# Patient Record
Sex: Female | Born: 1950
Health system: Southern US, Community
[De-identification: ages and names within clinical notes are randomized; demographics above are authoritative.]

## PROBLEM LIST (undated history)

## (undated) DIAGNOSIS — F329 Major depressive disorder, single episode, unspecified: Secondary | ICD-10-CM

## (undated) DIAGNOSIS — E039 Hypothyroidism, unspecified: Secondary | ICD-10-CM

## (undated) DIAGNOSIS — E559 Vitamin D deficiency, unspecified: Secondary | ICD-10-CM

## (undated) DIAGNOSIS — F32A Depression, unspecified: Secondary | ICD-10-CM

## (undated) DIAGNOSIS — B009 Herpesviral infection, unspecified: Secondary | ICD-10-CM

## (undated) DIAGNOSIS — M1990 Unspecified osteoarthritis, unspecified site: Secondary | ICD-10-CM

## (undated) DIAGNOSIS — J349 Unspecified disorder of nose and nasal sinuses: Secondary | ICD-10-CM

## (undated) HISTORY — PX: TONSILLECTOMY: SUR1361

## (undated) HISTORY — PX: WISDOM TOOTH EXTRACTION: SHX21

## (undated) HISTORY — PX: EXCISION OF TONGUE LESION: SHX6434

## (undated) HISTORY — PX: COLONOSCOPY: SHX5424

## (undated) HISTORY — PX: SHOULDER SURGERY: SHX246

## (undated) HISTORY — PX: DILATION AND CURETTAGE OF UTERUS: SHX78

---

## 2013-06-29 ENCOUNTER — Ambulatory Visit: Payer: Self-pay | Admitting: Adult Health

## 2013-07-05 ENCOUNTER — Ambulatory Visit: Payer: Self-pay | Admitting: Family Medicine

## 2013-11-03 ENCOUNTER — Other Ambulatory Visit (HOSPITAL_COMMUNITY)
Admission: RE | Admit: 2013-11-03 | Discharge: 2013-11-03 | Disposition: A | Discharge status: Home / Self Care | Payer: BC Managed Care – PPO | Source: Ambulatory Visit | Attending: Family Medicine | Admitting: Family Medicine

## 2013-11-03 ENCOUNTER — Other Ambulatory Visit: Payer: Self-pay | Admitting: Family Medicine

## 2013-11-03 DIAGNOSIS — Z1151 Encounter for screening for human papillomavirus (HPV): Secondary | ICD-10-CM | POA: Insufficient documentation

## 2013-11-03 DIAGNOSIS — Z0000 Encounter for general adult medical examination without abnormal findings: Secondary | ICD-10-CM | POA: Insufficient documentation

## 2013-12-14 ENCOUNTER — Other Ambulatory Visit: Payer: Self-pay | Admitting: Gastroenterology

## 2014-07-19 ENCOUNTER — Encounter (HOSPITAL_COMMUNITY): Payer: Self-pay | Admitting: *Deleted

## 2014-07-27 ENCOUNTER — Encounter (HOSPITAL_COMMUNITY): Payer: Self-pay | Admitting: Pharmacy Technician

## 2014-08-01 ENCOUNTER — Other Ambulatory Visit: Payer: Self-pay | Admitting: Gastroenterology

## 2014-08-01 NOTE — Addendum Note (Signed)
Addended by: Wilford Corner on: 08/01/2014 04:01 PM   Modules accepted: Orders

## 2014-08-02 ENCOUNTER — Ambulatory Visit (HOSPITAL_COMMUNITY)
Admission: RE | Admit: 2014-08-02 | Discharge: 2014-08-02 | Disposition: A | Discharge status: Home / Self Care | Payer: BC Managed Care – PPO | Source: Ambulatory Visit | Attending: Gastroenterology | Admitting: Gastroenterology

## 2014-08-02 ENCOUNTER — Encounter (HOSPITAL_COMMUNITY): Payer: Self-pay | Admitting: Anesthesiology

## 2014-08-02 ENCOUNTER — Ambulatory Visit (HOSPITAL_COMMUNITY): Payer: BC Managed Care – PPO | Admitting: Anesthesiology

## 2014-08-02 ENCOUNTER — Encounter (HOSPITAL_COMMUNITY)
Admission: RE | Disposition: A | Discharge status: Home / Self Care | Payer: Self-pay | Source: Ambulatory Visit | Attending: Gastroenterology

## 2014-08-02 DIAGNOSIS — F329 Major depressive disorder, single episode, unspecified: Secondary | ICD-10-CM | POA: Diagnosis not present

## 2014-08-02 DIAGNOSIS — D123 Benign neoplasm of transverse colon: Secondary | ICD-10-CM | POA: Diagnosis present

## 2014-08-02 DIAGNOSIS — M1990 Unspecified osteoarthritis, unspecified site: Secondary | ICD-10-CM | POA: Diagnosis not present

## 2014-08-02 DIAGNOSIS — Z87891 Personal history of nicotine dependence: Secondary | ICD-10-CM | POA: Insufficient documentation

## 2014-08-02 DIAGNOSIS — E039 Hypothyroidism, unspecified: Secondary | ICD-10-CM | POA: Diagnosis not present

## 2014-08-02 DIAGNOSIS — D124 Benign neoplasm of descending colon: Secondary | ICD-10-CM | POA: Insufficient documentation

## 2014-08-02 DIAGNOSIS — D122 Benign neoplasm of ascending colon: Secondary | ICD-10-CM | POA: Insufficient documentation

## 2014-08-02 DIAGNOSIS — K635 Polyp of colon: Secondary | ICD-10-CM | POA: Diagnosis present

## 2014-08-02 DIAGNOSIS — K648 Other hemorrhoids: Secondary | ICD-10-CM | POA: Insufficient documentation

## 2014-08-02 HISTORY — DX: Major depressive disorder, single episode, unspecified: F32.9

## 2014-08-02 HISTORY — DX: Unspecified osteoarthritis, unspecified site: M19.90

## 2014-08-02 HISTORY — DX: Depression, unspecified: F32.A

## 2014-08-02 HISTORY — DX: Vitamin D deficiency, unspecified: E55.9

## 2014-08-02 HISTORY — DX: Unspecified disorder of nose and nasal sinuses: J34.9

## 2014-08-02 HISTORY — PX: COLONOSCOPY WITH PROPOFOL: SHX5780

## 2014-08-02 HISTORY — PX: HOT HEMOSTASIS: SHX5433

## 2014-08-02 HISTORY — DX: Hypothyroidism, unspecified: E03.9

## 2014-08-02 SURGERY — COLONOSCOPY WITH PROPOFOL
Anesthesia: Monitor Anesthesia Care

## 2014-08-02 MED ORDER — PROPOFOL 10 MG/ML IV BOLUS
INTRAVENOUS | Status: AC
  Filled 2014-08-02: qty 20

## 2014-08-02 MED ORDER — PROPOFOL INFUSION 10 MG/ML OPTIME
INTRAVENOUS | Status: DC | PRN
  Administered 2014-08-02: 300 ug/kg/min via INTRAVENOUS

## 2014-08-02 MED ORDER — SODIUM CHLORIDE 0.9 % IV SOLN: INTRAVENOUS | Status: DC

## 2014-08-02 MED ORDER — LACTATED RINGERS IV SOLN
INTRAVENOUS | Status: DC
Start: 2014-08-02 — End: 2014-08-02
  Administered 2014-08-02: 1000 mL via INTRAVENOUS

## 2014-08-02 SURGICAL SUPPLY — 21 items

## 2014-08-02 NOTE — Transfer of Care (Signed)
Immediate Anesthesia Transfer of Care Note  Patient: Cindy Perez  Procedure(s) Performed: Procedure(s): COLONOSCOPY WITH PROPOFOL (N/A) HOT HEMOSTASIS (ARGON PLASMA COAGULATION/BICAP) (N/A)  Patient Location: PACU  Anesthesia Type:MAC  Level of Consciousness: awake, alert  and oriented  Airway & Oxygen Therapy: Patient Spontanous Breathing and Patient connected to face mask oxygen  Post-op Assessment: Report given to PACU RN and Post -op Vital signs reviewed and stable  Post vital signs: Reviewed and stable  Complications: No apparent anesthesia complications

## 2014-08-02 NOTE — Op Note (Signed)
[facility] [place], [id number]   COLONOSCOPY PROCEDURE REPORT     EXAM DATE: 08/02/2014  PATIENT NAME:      Cindy Perez, Cindy Perez           MR #:      [id number]  BIRTHDATE:       09-Jun-1951      VISIT #:     [id number]  ATTENDING:     Wilford Corner, MD     STATUS:     outpatient REFERRING MD: ASA CLASS:        Class II  INDICATIONS:  The patient is a 63 yr old female here for a colonoscopy due to colon polyp. PROCEDURE PERFORMED:     Colonoscopy with snare polypectomy MEDICATIONS:     Monitored anesthesia care and Per Anesthesia  ESTIMATED BLOOD LOSS:     None  CONSENT: The patient understands the risks and benefits of the procedure and understands that these risks include, but are not limited to: sedation, allergic reaction, infection, perforation and/or bleeding. Alternative means of evaluation and treatment include, among others: physical exam, x-rays, and/or surgical intervention. The patient elects to proceed with this endoscopic procedure.  DESCRIPTION OF PROCEDURE: During intra-op preparation period all mechanical & medical equipment was checked for proper function. Hand hygiene and appropriate measures for infection prevention was taken. After the risks, benefits and alternatives of the procedure were thoroughly explained, Informed consent was verified, confirmed and timeout was successfully executed by the treatment team. A digital exam revealed no abnormalities of the rectum.      The Pentax Ped Colon [id number] endoscope was introduced through the anus and advanced to the cecum, which was identified by both the appendix and ileocecal valve. The terminal ileum was intubated and was normal in appearance. The prep was good. The instrument was then slowly withdrawn as the colon was fully examined. In the ascending colon a previously tattooed area was noted and proximal to this mucosa was a 1.4 cm semi-sessile polyp that  was removed with snare cautery without any immediate complications.  At the site of the tattooed mucosa was a 4 mm and 68mm semi-sessile polyp that could not be snared due to it flattening out with each attempt. APC was then used to fulgurate this tissue. In the descending colon there was a 3 mm semi-sessile polyp that was removed with cold biopsy forceps. Multiple sigmoid diverticuli were seen and several inverted diverticuli were also noted. Retroflexed views revealed small internal hemorrhoids.  The scope was then completely withdrawn from the patient and the procedure terminated.     ADVERSE EVENTS:      There were no immediate complications.   IMPRESSIONS:     1. Ascending colon polyp - s/p snare cautery 2. S/P APC of residual polyp in ascending colon 3. Descending colon polyp removed with cold biopsy forceps 4. Internal hemorrhoids  RECOMMENDATIONS:     No aspirin products for 2 weeks; F/U on path; Timing of repeat colon pending path results   Wilford Corner, MD eSigned:  Wilford Corner, MD 08/02/2014 11:08 AM   cc:  Cari Caraway, MD  CPT CODES: ICD CODES:  The ICD and CPT codes recommended by this software are interpretations from the data that the clinical staff has captured with the software.  The verification of the translation of this report to the ICD and CPT codes and modifiers is the sole responsibility of the health care institution and practicing physician where this report  was generated.  [employer] will not be held responsible for the validity of the ICD and CPT codes included on this report.  AMA assumes no liability for data contained or not contained herein. C[employer] is a Designer, television/film set of the [employer].  PATIENT NAME:  Cindy Perez MR#: [id number]

## 2014-08-02 NOTE — Anesthesia Preprocedure Evaluation (Signed)
Anesthesia Evaluation  Patient identified by MRN, date of birth, ID band Patient awake    Reviewed: Allergy & Precautions, H&P , NPO status , Patient's Chart, lab work & pertinent test results  History of Anesthesia Complications Negative for: history of anesthetic complications  Airway Mallampati: II  TM Distance: >3 FB Neck ROM: Full    Dental  (+) Teeth Intact   Pulmonary neg shortness of breath, neg sleep apnea, neg COPDneg recent URI, former smoker,  breath sounds clear to auscultation        Cardiovascular negative cardio ROS  Rhythm:Regular     Neuro/Psych PSYCHIATRIC DISORDERS Depression negative neurological ROS     GI/Hepatic Neg liver ROS, Colon polyp   Endo/Other  Hypothyroidism   Renal/GU negative Renal ROS     Musculoskeletal  (+) Arthritis -, Osteoarthritis,    Abdominal   Peds  Hematology negative hematology ROS (+)   Anesthesia Other Findings   Reproductive/Obstetrics                             Anesthesia Physical Anesthesia Plan  ASA: II  Anesthesia Plan: MAC   Post-op Pain Management:    Induction: Intravenous  Airway Management Planned: Natural Airway and Simple Face Mask  Additional Equipment: None  Intra-op Plan:   Post-operative Plan:   Informed Consent: I have reviewed the patients History and Physical, chart, labs and discussed the procedure including the risks, benefits and alternatives for the proposed anesthesia with the patient or authorized representative who has indicated his/her understanding and acceptance.   Dental advisory given  Plan Discussed with: CRNA and Surgeon  Anesthesia Plan Comments:         Anesthesia Quick Evaluation

## 2014-08-02 NOTE — Discharge Instructions (Addendum)
No aspirin or ibuprofen products for 2 weeks. Will call you when the pathology results are complete.   Colonoscopy, Care After Refer to this sheet in the next few weeks. These instructions provide you with information on caring for yourself after your procedure. Your health care provider may also give you more specific instructions. Your treatment has been planned according to current medical practices, but problems sometimes occur. Call your health care provider if you have any problems or questions after your procedure. WHAT TO EXPECT AFTER THE PROCEDURE  After your procedure, it is typical to have the following:  A small amount of blood in your stool.  Moderate amounts of gas and mild abdominal cramping or bloating. HOME CARE INSTRUCTIONS  Do not drive, operate machinery, or sign important documents for 24 hours.  You may shower and resume your regular physical activities, but move at a slower pace for the first 24 hours.  Take frequent rest periods for the first 24 hours.  Walk around or put a warm pack on your abdomen to help reduce abdominal cramping and bloating.  Drink enough fluids to keep your urine clear or pale yellow.  You may resume your normal diet as instructed by your health care provider. Avoid heavy or fried foods that are hard to digest.  Avoid drinking alcohol for 24 hours or as instructed by your health care provider.  Only take over-the-counter or prescription medicines as directed by your health care provider.  If a tissue sample (biopsy) was taken during your procedure:  Do not take aspirin or blood thinners for 7 days, or as instructed by your health care provider.  Do not drink alcohol for 7 days, or as instructed by your health care provider.  Eat soft foods for the first 24 hours. SEEK MEDICAL CARE IF: You have persistent spotting of blood in your stool 2-3 days after the procedure. SEEK IMMEDIATE MEDICAL CARE IF:  You have more than a small  spotting of blood in your stool.  You pass large blood clots in your stool.  Your abdomen is swollen (distended).  You have nausea or vomiting.  You have a fever.  You have increasing abdominal pain that is not relieved with medicine. Document Released: 04/30/2004 Document Revised: 07/07/2013 Document Reviewed: 05/24/2013 [facility] Patient Information 2015 [place], [place]. This information is not intended to replace advice given to you by your health care provider. Make sure you discuss any questions you have with your health care provider.

## 2014-08-02 NOTE — H&P (Signed)
  Date of Initial H&P: 07/29/14  History reviewed, patient examined, no change in status, stable for surgery.

## 2014-08-02 NOTE — Interval H&P Note (Signed)
History and Physical Interval Note:  08/02/2014 9:13 AM  Cindy Perez  has presented today for surgery, with the diagnosis of colon polyp  The various methods of treatment have been discussed with the patient and family. After consideration of risks, benefits and other options for treatment, the patient has consented to  Procedure(s): COLONOSCOPY WITH PROPOFOL (N/A) HOT HEMOSTASIS (ARGON PLASMA COAGULATION/BICAP) (N/A) as a surgical intervention .  The patient's history has been reviewed, patient examined, no change in status, stable for surgery.  I have reviewed the patient's chart and labs.  Questions were answered to the patient's satisfaction.     [place] C.

## 2014-08-02 NOTE — Anesthesia Postprocedure Evaluation (Signed)
  Anesthesia Post-op Note  Patient: Cindy Perez  Procedure(s) Performed: Procedure(s): COLONOSCOPY WITH PROPOFOL (N/A) HOT HEMOSTASIS (ARGON PLASMA COAGULATION/BICAP) (N/A)  Patient Location: PACU  Anesthesia Type:MAC  Level of Consciousness: awake and alert   Airway and Oxygen Therapy: Patient Spontanous Breathing  Post-op Pain: none  Post-op Assessment: Post-op Vital signs reviewed, Patient's Cardiovascular Status Stable, Respiratory Function Stable, Patent Airway, No signs of Nausea or vomiting and Pain level controlled  Post-op Vital Signs: Reviewed and stable  Last Vitals:  Filed Vitals:   08/02/14 1132  BP: 118/89  Pulse: 49  Temp:   Resp: 17    Complications: No apparent anesthesia complications

## 2014-08-03 ENCOUNTER — Encounter (HOSPITAL_COMMUNITY): Payer: Self-pay | Admitting: Gastroenterology

## 2014-11-04 DIAGNOSIS — F339 Major depressive disorder, recurrent, unspecified: Secondary | ICD-10-CM | POA: Diagnosis not present

## 2014-11-04 DIAGNOSIS — B009 Herpesviral infection, unspecified: Secondary | ICD-10-CM | POA: Diagnosis not present

## 2014-11-04 DIAGNOSIS — Z0000 Encounter for general adult medical examination without abnormal findings: Secondary | ICD-10-CM | POA: Diagnosis not present

## 2014-11-04 DIAGNOSIS — E559 Vitamin D deficiency, unspecified: Secondary | ICD-10-CM | POA: Diagnosis not present

## 2014-11-04 DIAGNOSIS — Z23 Encounter for immunization: Secondary | ICD-10-CM | POA: Diagnosis not present

## 2014-11-04 DIAGNOSIS — E039 Hypothyroidism, unspecified: Secondary | ICD-10-CM | POA: Diagnosis not present

## 2014-11-04 DIAGNOSIS — E782 Mixed hyperlipidemia: Secondary | ICD-10-CM | POA: Diagnosis not present

## 2015-03-29 ENCOUNTER — Emergency Department: Payer: Medicare Other

## 2015-03-29 ENCOUNTER — Encounter: Payer: Self-pay | Admitting: Emergency Medicine

## 2015-03-29 ENCOUNTER — Emergency Department
Admission: EM | Admit: 2015-03-29 | Discharge: 2015-03-29 | Disposition: A | Discharge status: Home / Self Care | Payer: Medicare Other | Attending: Emergency Medicine | Admitting: Emergency Medicine

## 2015-03-29 DIAGNOSIS — Z87891 Personal history of nicotine dependence: Secondary | ICD-10-CM | POA: Insufficient documentation

## 2015-03-29 DIAGNOSIS — S0990XA Unspecified injury of head, initial encounter: Secondary | ICD-10-CM | POA: Diagnosis not present

## 2015-03-29 DIAGNOSIS — S0083XA Contusion of other part of head, initial encounter: Secondary | ICD-10-CM | POA: Insufficient documentation

## 2015-03-29 DIAGNOSIS — E039 Hypothyroidism, unspecified: Secondary | ICD-10-CM | POA: Insufficient documentation

## 2015-03-29 DIAGNOSIS — Y998 Other external cause status: Secondary | ICD-10-CM | POA: Diagnosis not present

## 2015-03-29 DIAGNOSIS — Y9289 Other specified places as the place of occurrence of the external cause: Secondary | ICD-10-CM | POA: Insufficient documentation

## 2015-03-29 DIAGNOSIS — S7012XA Contusion of left thigh, initial encounter: Secondary | ICD-10-CM | POA: Insufficient documentation

## 2015-03-29 DIAGNOSIS — S63501A Unspecified sprain of right wrist, initial encounter: Secondary | ICD-10-CM | POA: Diagnosis not present

## 2015-03-29 DIAGNOSIS — S93401A Sprain of unspecified ligament of right ankle, initial encounter: Secondary | ICD-10-CM | POA: Insufficient documentation

## 2015-03-29 DIAGNOSIS — Y9389 Activity, other specified: Secondary | ICD-10-CM | POA: Insufficient documentation

## 2015-03-29 DIAGNOSIS — M79602 Pain in left arm: Secondary | ICD-10-CM | POA: Diagnosis not present

## 2015-03-29 DIAGNOSIS — F329 Major depressive disorder, single episode, unspecified: Secondary | ICD-10-CM | POA: Diagnosis not present

## 2015-03-29 DIAGNOSIS — Z7982 Long term (current) use of aspirin: Secondary | ICD-10-CM | POA: Diagnosis not present

## 2015-03-29 DIAGNOSIS — W07XXXA Fall from chair, initial encounter: Secondary | ICD-10-CM | POA: Diagnosis not present

## 2015-03-29 DIAGNOSIS — Z79899 Other long term (current) drug therapy: Secondary | ICD-10-CM | POA: Insufficient documentation

## 2015-03-29 DIAGNOSIS — M25531 Pain in right wrist: Secondary | ICD-10-CM | POA: Diagnosis not present

## 2015-03-29 DIAGNOSIS — S99911A Unspecified injury of right ankle, initial encounter: Secondary | ICD-10-CM | POA: Diagnosis not present

## 2015-03-29 DIAGNOSIS — S6991XA Unspecified injury of right wrist, hand and finger(s), initial encounter: Secondary | ICD-10-CM | POA: Diagnosis not present

## 2015-03-29 DIAGNOSIS — T148XXA Other injury of unspecified body region, initial encounter: Secondary | ICD-10-CM

## 2015-03-29 HISTORY — DX: Herpesviral infection, unspecified: B00.9

## 2015-03-29 MED ORDER — TRAMADOL HCL 50 MG PO TABS
ORAL_TABLET | ORAL | Status: AC
  Filled 2015-03-29: qty 1

## 2015-03-29 MED ORDER — TRAMADOL HCL 50 MG PO TABS
50.0000 mg | ORAL_TABLET | Freq: Once | ORAL | Status: AC
  Administered 2015-03-29: 50 mg via ORAL

## 2015-03-29 MED ORDER — TRAMADOL HCL 50 MG PO TABS: 50.0000 mg | ORAL_TABLET | Freq: Four times a day (QID) | ORAL | Status: AC | PRN

## 2015-03-29 NOTE — ED Notes (Signed)
Pt sent over from [place] walk in clinic for fall. Pt states she was standing on chair and fell. Swelling and bruising noted over left eye. Pain to right hand and right foot. Pt also endorse pain to left thigh. States she does not know if she lost consciousness or not. States she takes Aspirin 81mg  daily.

## 2015-03-29 NOTE — ED Notes (Signed)
C/o left ankle pain, however all distal neuro intact. Full rom.   Patient has large hematoma to the left anterior thigh.

## 2015-03-29 NOTE — ED Provider Notes (Signed)
[facility] Emergency Department Provider Note  ____________________________________________  Time seen: Approximately 6:37 PM  I have reviewed the triage vital signs and the nursing notes.   HISTORY  Chief Complaint Fall    HPI Cindy Perez is a 64 y.o. female patient here today for hematoma to the left forehead hematoma to the left thigh pain and edema to the right wrist and pain and edema to the right ankle. Injury as mentioned above secondary to a fall. She was standing on a chair loss of balance and fell. Patient is unsure of loss of consciousness. Patient states she had keys in a pocket and believes she fell on the left thigh causing the edema. Patient admits to taking an 81 mg aspirin daily. Patient rates the pain complaint as a 4/10.   Past Medical History  Diagnosis Date  . Hypothyroidism   . Depression   . Vitamin D deficiency     takes supplement  . Arthritis     neck, hands, toes  . Sinus trouble     occasional  . Herpes     Patient Active Problem List   Diagnosis Date Noted  . Colon polyps 08/02/2014    Past Surgical History  Procedure Laterality Date  . Colonoscopy    . Tonsillectomy    . Dilation and curettage of uterus    . Shoulder surgery Left     benign tumor removed  . Wisdom tooth extraction    . Excision of tongue lesion    . Colonoscopy with propofol N/A 08/02/2014    Procedure: COLONOSCOPY WITH PROPOFOL;  Surgeon: Lear Ng, MD;  Location: [facility] ENDOSCOPY;  Service: Endoscopy;  Laterality: N/A;  . Hot hemostasis N/A 08/02/2014    Procedure: HOT HEMOSTASIS (ARGON PLASMA COAGULATION/BICAP);  Surgeon: Lear Ng, MD;  Location: [facility];  Service: Endoscopy;  Laterality: N/A;    Current Outpatient Rx  Name  Route  Sig  Dispense  Refill  . aspirin EC 81 MG tablet   Oral   Take 81 mg by mouth daily.         Marland Kitchen ibuprofen (ADVIL,MOTRIN) 200 MG tablet   Oral   Take 400 mg by mouth every 6 (six)  hours as needed for mild pain.         Marland Kitchen levothyroxine (SYNTHROID, LEVOTHROID) 88 MCG tablet   Oral   Take 88 mcg by mouth daily before breakfast.         . pseudoephedrine (SUDAFED) 30 MG tablet   Oral   Take 30 mg by mouth every 4 (four) hours as needed for congestion.         . sertraline (ZOLOFT) 50 MG tablet   Oral   Take 50 mg by mouth every morning.         . traMADol (ULTRAM) 50 MG tablet   Oral   Take 1 tablet (50 mg total) by mouth every 6 (six) hours as needed for moderate pain.   12 tablet   0   . Vitamin D, Ergocalciferol, (DRISDOL) 50000 UNITS CAPS capsule   Oral   Take 50,000 Units by mouth every 7 (seven) days.           Allergies Bee venom; Shellfish allergy; Codeine; Sulfa antibiotics; and Influenza vaccines  No family history on file.  Social History History  Substance Use Topics  . Smoking status: Former Smoker    Types: Cigarettes    Quit date: 07/19/1977  . Smokeless tobacco: Never  Used  . Alcohol Use: Yes     Comment: occ 1 drink weekly    Review of Systems Constitutional: No fever/chills Eyes: No visual changes. ENT: No sore throat. Cardiovascular: Denies chest pain. Respiratory: Denies shortness of breath. Gastrointestinal: No abdominal pain.  No nausea, no vomiting.  No diarrhea.  No constipation. Genitourinary: Negative for dysuria. Musculoskeletal: Right wrist right ankle and left thigh pain.  Skin: Hematoma to the left thigh and left side of forehead. Neurological: Negative for headaches, focal weakness or numbness. Psychiatric:Depression Endocrine:Hypothyroidism Allergic/Immunilogical: See medication list  10-point ROS otherwise negative.  ____________________________________________   PHYSICAL EXAM:  VITAL SIGNS: ED Triage Vitals  Enc Vitals Group     BP 03/29/15 1744 132/54 mmHg     Pulse Rate 03/29/15 1744 67     Resp 03/29/15 1744 18     Temp 03/29/15 1744 97.8 F (36.6 C)     Temp Source 03/29/15  1744 Oral     SpO2 03/29/15 1744 97 %     Weight 03/29/15 1744 138 lb (62.596 kg)     Height 03/29/15 1744 5\' 8"  (1.727 m)     Head Cir --      Peak Flow --      Pain Score --      Pain Loc --      Pain Edu? --      Excl. in [place]? --    Constitutional: Alert and oriented. Well appearing and in no acute distress. Eyes: Conjunctivae are normal. PERRL. EOMI. Head:  Hematoma left forehead Nose: No congestion/rhinnorhea. Mouth/Throat: Mucous membranes are moist.  Oropharynx non-erythematous. Neck: No stridor. No cervical spine tenderness to palpation. Hematological/Lymphatic/Immunilogical: No cervical lymphadenopathy. Cardiovascular: Normal rate, regular rhythm. Grossly normal heart sounds.  Good peripheral circulation. Respiratory: Normal respiratory effort.  No retractions. Lungs CTAB. Gastrointestinal: Soft and nontender. No distention. No abdominal bruits. No CVA tenderness. Musculoskeletal: No obvious deformity to the upper and lower extremities. Upper and lower extremities are neurovascular intact. Patient has decreased range of motion of hyperextension of the right wrist. No obvious edema of the right wrist. No edema to the right ankle the patient is tender to palpation the medial aspect of the ankle. Patient has decreased range of motion limited by complaining of pain with inversion. Neurologic:  Normal speech and language. No gross focal neurologic deficits are appreciated. Speech is normal. No gait instability. Skin:  Skin is warm, dry and intact. No rash noted. Hematoma left forehead and mid left thigh. Psychiatric: Mood and affect are normal. Speech and behavior are normal.  ____________________________________________   LABS (all labs ordered are listed, but only abnormal results are displayed)  Labs Reviewed - No data to display ____________________________________________  EKG   ____________________________________________  RADIOLOGY  CT of the head was unremarkable  except for the above hematoma. X-rays of the right wrist, right ankle, and left thigh unremarkable. Hematoma was visible on the x-ray of the left thigh.I, Sable Feil, personally viewed and evaluated these images as part of my medical decision making.   ____________________________________________   PROCEDURES  Procedure(s) performed: None  Critical Care performed: No  ____________________________________________   INITIAL IMPRESSION / ASSESSMENT AND PLAN / ED COURSE  Pertinent labs & imaging results that were available during my care of the patient were reviewed by me and considered in my medical decision making (see chart for details).  Contusion hematoma to the left forehead. Sprain right wrist. Sprain right ankle. Contusion hematoma to the mid left thigh.  Discussed x-ray findings with patient. Discussed home care with patient. Patient was given prescriptions for tramadol take as needed for pain. Skin marker which she was outlined hematoma on the left thigh. Patient advised to follow-up with her PCP in 2 days for reevaluation. Patient advised return back to the ER if her condition worsensor develop any shortness of breath. ____________________________________________   FINAL CLINICAL IMPRESSION(S) / ED DIAGNOSES  Final diagnoses:  Hematoma  Facial hematoma, initial encounter  Thigh hematoma, left, initial encounter  Sprain of right wrist, initial encounter  Sprain of right ankle, initial encounter      Sable Feil, PA-C 03/29/15 1946  Earleen Newport, MD 03/29/15 2219

## 2015-03-31 DIAGNOSIS — T148 Other injury of unspecified body region: Secondary | ICD-10-CM | POA: Diagnosis not present

## 2015-03-31 DIAGNOSIS — M2550 Pain in unspecified joint: Secondary | ICD-10-CM | POA: Diagnosis not present

## 2015-03-31 DIAGNOSIS — S060X9A Concussion with loss of consciousness of unspecified duration, initial encounter: Secondary | ICD-10-CM | POA: Diagnosis not present

## 2015-07-04 DIAGNOSIS — B009 Herpesviral infection, unspecified: Secondary | ICD-10-CM | POA: Diagnosis not present

## 2015-07-04 DIAGNOSIS — E782 Mixed hyperlipidemia: Secondary | ICD-10-CM | POA: Diagnosis not present

## 2015-07-04 DIAGNOSIS — F3340 Major depressive disorder, recurrent, in remission, unspecified: Secondary | ICD-10-CM | POA: Diagnosis not present

## 2015-07-04 DIAGNOSIS — E559 Vitamin D deficiency, unspecified: Secondary | ICD-10-CM | POA: Diagnosis not present

## 2015-07-04 DIAGNOSIS — E039 Hypothyroidism, unspecified: Secondary | ICD-10-CM | POA: Diagnosis not present

## 2015-07-04 DIAGNOSIS — F0781 Postconcussional syndrome: Secondary | ICD-10-CM | POA: Diagnosis not present

## 2015-11-03 DIAGNOSIS — J00 Acute nasopharyngitis [common cold]: Secondary | ICD-10-CM | POA: Diagnosis not present

## 2015-11-10 DIAGNOSIS — Z7189 Other specified counseling: Secondary | ICD-10-CM | POA: Diagnosis not present

## 2015-11-10 DIAGNOSIS — E782 Mixed hyperlipidemia: Secondary | ICD-10-CM | POA: Diagnosis not present

## 2015-11-10 DIAGNOSIS — B009 Herpesviral infection, unspecified: Secondary | ICD-10-CM | POA: Diagnosis not present

## 2015-11-10 DIAGNOSIS — F0781 Postconcussional syndrome: Secondary | ICD-10-CM | POA: Diagnosis not present

## 2015-11-10 DIAGNOSIS — Z0000 Encounter for general adult medical examination without abnormal findings: Secondary | ICD-10-CM | POA: Diagnosis not present

## 2015-11-10 DIAGNOSIS — E039 Hypothyroidism, unspecified: Secondary | ICD-10-CM | POA: Diagnosis not present

## 2015-11-10 DIAGNOSIS — N959 Unspecified menopausal and perimenopausal disorder: Secondary | ICD-10-CM | POA: Diagnosis not present

## 2015-11-10 DIAGNOSIS — E559 Vitamin D deficiency, unspecified: Secondary | ICD-10-CM | POA: Diagnosis not present

## 2015-11-10 DIAGNOSIS — F3340 Major depressive disorder, recurrent, in remission, unspecified: Secondary | ICD-10-CM | POA: Diagnosis not present

## 2015-11-10 DIAGNOSIS — Z124 Encounter for screening for malignant neoplasm of cervix: Secondary | ICD-10-CM | POA: Diagnosis not present

## 2015-12-07 DIAGNOSIS — E2839 Other primary ovarian failure: Secondary | ICD-10-CM | POA: Diagnosis not present

## 2015-12-07 DIAGNOSIS — M810 Age-related osteoporosis without current pathological fracture: Secondary | ICD-10-CM | POA: Diagnosis not present

## 2015-12-27 DIAGNOSIS — M810 Age-related osteoporosis without current pathological fracture: Secondary | ICD-10-CM | POA: Diagnosis not present

## 2015-12-27 DIAGNOSIS — E782 Mixed hyperlipidemia: Secondary | ICD-10-CM | POA: Diagnosis not present

## 2016-01-09 DIAGNOSIS — E039 Hypothyroidism, unspecified: Secondary | ICD-10-CM | POA: Diagnosis not present

## 2016-01-09 DIAGNOSIS — F3340 Major depressive disorder, recurrent, in remission, unspecified: Secondary | ICD-10-CM | POA: Diagnosis not present

## 2016-01-09 DIAGNOSIS — Z0000 Encounter for general adult medical examination without abnormal findings: Secondary | ICD-10-CM | POA: Diagnosis not present

## 2016-01-09 DIAGNOSIS — E559 Vitamin D deficiency, unspecified: Secondary | ICD-10-CM | POA: Diagnosis not present

## 2016-01-09 DIAGNOSIS — F0781 Postconcussional syndrome: Secondary | ICD-10-CM | POA: Diagnosis not present

## 2016-01-09 DIAGNOSIS — Z7189 Other specified counseling: Secondary | ICD-10-CM | POA: Diagnosis not present

## 2016-01-09 DIAGNOSIS — E782 Mixed hyperlipidemia: Secondary | ICD-10-CM | POA: Diagnosis not present

## 2016-01-09 DIAGNOSIS — N959 Unspecified menopausal and perimenopausal disorder: Secondary | ICD-10-CM | POA: Diagnosis not present

## 2016-10-10 DIAGNOSIS — H353131 Nonexudative age-related macular degeneration, bilateral, early dry stage: Secondary | ICD-10-CM | POA: Diagnosis not present

## 2016-10-10 DIAGNOSIS — H5213 Myopia, bilateral: Secondary | ICD-10-CM | POA: Diagnosis not present

## 2016-10-10 DIAGNOSIS — H35372 Puckering of macula, left eye: Secondary | ICD-10-CM | POA: Diagnosis not present

## 2016-11-11 DIAGNOSIS — B009 Herpesviral infection, unspecified: Secondary | ICD-10-CM | POA: Diagnosis not present

## 2016-11-11 DIAGNOSIS — M810 Age-related osteoporosis without current pathological fracture: Secondary | ICD-10-CM | POA: Diagnosis not present

## 2016-11-11 DIAGNOSIS — Z7189 Other specified counseling: Secondary | ICD-10-CM | POA: Diagnosis not present

## 2016-11-11 DIAGNOSIS — Z23 Encounter for immunization: Secondary | ICD-10-CM | POA: Diagnosis not present

## 2016-11-11 DIAGNOSIS — Z0001 Encounter for general adult medical examination with abnormal findings: Secondary | ICD-10-CM | POA: Diagnosis not present

## 2016-11-11 DIAGNOSIS — E782 Mixed hyperlipidemia: Secondary | ICD-10-CM | POA: Diagnosis not present

## 2016-11-11 DIAGNOSIS — E559 Vitamin D deficiency, unspecified: Secondary | ICD-10-CM | POA: Diagnosis not present

## 2016-11-11 DIAGNOSIS — F3341 Major depressive disorder, recurrent, in partial remission: Secondary | ICD-10-CM | POA: Diagnosis not present

## 2016-11-11 DIAGNOSIS — E039 Hypothyroidism, unspecified: Secondary | ICD-10-CM | POA: Diagnosis not present

## 2016-12-05 DIAGNOSIS — M25561 Pain in right knee: Secondary | ICD-10-CM | POA: Diagnosis not present

## 2016-12-05 DIAGNOSIS — F3340 Major depressive disorder, recurrent, in remission, unspecified: Secondary | ICD-10-CM | POA: Diagnosis not present

## 2016-12-05 DIAGNOSIS — F0781 Postconcussional syndrome: Secondary | ICD-10-CM | POA: Diagnosis not present

## 2017-05-26 DIAGNOSIS — E039 Hypothyroidism, unspecified: Secondary | ICD-10-CM | POA: Diagnosis not present

## 2017-05-26 DIAGNOSIS — F0781 Postconcussional syndrome: Secondary | ICD-10-CM | POA: Diagnosis not present

## 2017-05-26 DIAGNOSIS — F3340 Major depressive disorder, recurrent, in remission, unspecified: Secondary | ICD-10-CM | POA: Diagnosis not present

## 2017-05-26 DIAGNOSIS — M810 Age-related osteoporosis without current pathological fracture: Secondary | ICD-10-CM | POA: Diagnosis not present

## 2017-05-26 DIAGNOSIS — Z23 Encounter for immunization: Secondary | ICD-10-CM | POA: Diagnosis not present

## 2017-05-26 DIAGNOSIS — E559 Vitamin D deficiency, unspecified: Secondary | ICD-10-CM | POA: Diagnosis not present

## 2017-05-26 DIAGNOSIS — E782 Mixed hyperlipidemia: Secondary | ICD-10-CM | POA: Diagnosis not present

## 2017-07-03 DIAGNOSIS — D485 Neoplasm of uncertain behavior of skin: Secondary | ICD-10-CM | POA: Diagnosis not present

## 2017-07-03 DIAGNOSIS — D2262 Melanocytic nevi of left upper limb, including shoulder: Secondary | ICD-10-CM | POA: Diagnosis not present

## 2017-07-03 DIAGNOSIS — D2271 Melanocytic nevi of right lower limb, including hip: Secondary | ICD-10-CM | POA: Diagnosis not present

## 2017-07-03 DIAGNOSIS — D2272 Melanocytic nevi of left lower limb, including hip: Secondary | ICD-10-CM | POA: Diagnosis not present

## 2017-07-03 DIAGNOSIS — D2261 Melanocytic nevi of right upper limb, including shoulder: Secondary | ICD-10-CM | POA: Diagnosis not present

## 2017-07-03 DIAGNOSIS — C44329 Squamous cell carcinoma of skin of other parts of face: Secondary | ICD-10-CM | POA: Diagnosis not present

## 2017-08-25 DIAGNOSIS — C44329 Squamous cell carcinoma of skin of other parts of face: Secondary | ICD-10-CM | POA: Diagnosis not present

## 2017-08-25 DIAGNOSIS — L905 Scar conditions and fibrosis of skin: Secondary | ICD-10-CM | POA: Diagnosis not present

## 2017-10-23 ENCOUNTER — Other Ambulatory Visit: Payer: Self-pay | Admitting: Family Medicine

## 2017-10-23 DIAGNOSIS — Z1231 Encounter for screening mammogram for malignant neoplasm of breast: Secondary | ICD-10-CM

## 2017-11-10 DIAGNOSIS — D2261 Melanocytic nevi of right upper limb, including shoulder: Secondary | ICD-10-CM | POA: Diagnosis not present

## 2017-11-10 DIAGNOSIS — D2272 Melanocytic nevi of left lower limb, including hip: Secondary | ICD-10-CM | POA: Diagnosis not present

## 2017-11-10 DIAGNOSIS — Z85828 Personal history of other malignant neoplasm of skin: Secondary | ICD-10-CM | POA: Diagnosis not present

## 2017-11-10 DIAGNOSIS — D2271 Melanocytic nevi of right lower limb, including hip: Secondary | ICD-10-CM | POA: Diagnosis not present

## 2017-11-13 DIAGNOSIS — F3340 Major depressive disorder, recurrent, in remission, unspecified: Secondary | ICD-10-CM | POA: Diagnosis not present

## 2017-11-13 DIAGNOSIS — E782 Mixed hyperlipidemia: Secondary | ICD-10-CM | POA: Diagnosis not present

## 2017-11-13 DIAGNOSIS — B009 Herpesviral infection, unspecified: Secondary | ICD-10-CM | POA: Diagnosis not present

## 2017-11-13 DIAGNOSIS — F3341 Major depressive disorder, recurrent, in partial remission: Secondary | ICD-10-CM | POA: Diagnosis not present

## 2017-11-13 DIAGNOSIS — E559 Vitamin D deficiency, unspecified: Secondary | ICD-10-CM | POA: Diagnosis not present

## 2017-11-13 DIAGNOSIS — Z0000 Encounter for general adult medical examination without abnormal findings: Secondary | ICD-10-CM | POA: Diagnosis not present

## 2017-11-13 DIAGNOSIS — M810 Age-related osteoporosis without current pathological fracture: Secondary | ICD-10-CM | POA: Diagnosis not present

## 2017-11-13 DIAGNOSIS — E039 Hypothyroidism, unspecified: Secondary | ICD-10-CM | POA: Diagnosis not present

## 2017-11-20 ENCOUNTER — Ambulatory Visit
Admission: RE | Admit: 2017-11-20 | Discharge: 2017-11-20 | Disposition: A | Discharge status: Home / Self Care | Payer: Medicare PPO | Source: Ambulatory Visit | Attending: Family Medicine | Admitting: Family Medicine

## 2017-11-20 DIAGNOSIS — Z1231 Encounter for screening mammogram for malignant neoplasm of breast: Secondary | ICD-10-CM

## 2018-05-15 DIAGNOSIS — F0781 Postconcussional syndrome: Secondary | ICD-10-CM | POA: Diagnosis not present

## 2018-05-15 DIAGNOSIS — E782 Mixed hyperlipidemia: Secondary | ICD-10-CM | POA: Diagnosis not present

## 2018-05-15 DIAGNOSIS — M810 Age-related osteoporosis without current pathological fracture: Secondary | ICD-10-CM | POA: Diagnosis not present

## 2018-05-15 DIAGNOSIS — E559 Vitamin D deficiency, unspecified: Secondary | ICD-10-CM | POA: Diagnosis not present

## 2018-05-15 DIAGNOSIS — B009 Herpesviral infection, unspecified: Secondary | ICD-10-CM | POA: Diagnosis not present

## 2018-05-15 DIAGNOSIS — E039 Hypothyroidism, unspecified: Secondary | ICD-10-CM | POA: Diagnosis not present

## 2018-05-15 DIAGNOSIS — F3340 Major depressive disorder, recurrent, in remission, unspecified: Secondary | ICD-10-CM | POA: Diagnosis not present

## 2018-07-15 LAB — GLUCOSE, POCT (MANUAL RESULT ENTRY): POC GLUCOSE: 85 mg/dL (ref 70–99)

## 2018-07-31 ENCOUNTER — Emergency Department: Payer: Medicare PPO

## 2018-07-31 ENCOUNTER — Other Ambulatory Visit: Payer: Self-pay

## 2018-07-31 ENCOUNTER — Emergency Department
Admission: EM | Admit: 2018-07-31 | Discharge: 2018-07-31 | Disposition: A | Discharge status: Home / Self Care | Payer: Medicare PPO | Attending: Emergency Medicine | Admitting: Emergency Medicine

## 2018-07-31 ENCOUNTER — Encounter: Payer: Self-pay | Admitting: Emergency Medicine

## 2018-07-31 DIAGNOSIS — I959 Hypotension, unspecified: Secondary | ICD-10-CM | POA: Diagnosis not present

## 2018-07-31 DIAGNOSIS — Y9301 Activity, walking, marching and hiking: Secondary | ICD-10-CM | POA: Diagnosis not present

## 2018-07-31 DIAGNOSIS — S199XXA Unspecified injury of neck, initial encounter: Secondary | ICD-10-CM | POA: Diagnosis not present

## 2018-07-31 DIAGNOSIS — S0990XA Unspecified injury of head, initial encounter: Secondary | ICD-10-CM | POA: Diagnosis not present

## 2018-07-31 DIAGNOSIS — S50311A Abrasion of right elbow, initial encounter: Secondary | ICD-10-CM | POA: Diagnosis not present

## 2018-07-31 DIAGNOSIS — S4991XA Unspecified injury of right shoulder and upper arm, initial encounter: Secondary | ICD-10-CM | POA: Diagnosis not present

## 2018-07-31 DIAGNOSIS — Z87891 Personal history of nicotine dependence: Secondary | ICD-10-CM | POA: Diagnosis not present

## 2018-07-31 DIAGNOSIS — S51011A Laceration without foreign body of right elbow, initial encounter: Secondary | ICD-10-CM | POA: Diagnosis not present

## 2018-07-31 DIAGNOSIS — E039 Hypothyroidism, unspecified: Secondary | ICD-10-CM | POA: Insufficient documentation

## 2018-07-31 DIAGNOSIS — Z23 Encounter for immunization: Secondary | ICD-10-CM | POA: Insufficient documentation

## 2018-07-31 DIAGNOSIS — R0902 Hypoxemia: Secondary | ICD-10-CM | POA: Diagnosis not present

## 2018-07-31 DIAGNOSIS — W01198A Fall on same level from slipping, tripping and stumbling with subsequent striking against other object, initial encounter: Secondary | ICD-10-CM | POA: Diagnosis not present

## 2018-07-31 DIAGNOSIS — Y929 Unspecified place or not applicable: Secondary | ICD-10-CM | POA: Insufficient documentation

## 2018-07-31 DIAGNOSIS — S0181XA Laceration without foreign body of other part of head, initial encounter: Secondary | ICD-10-CM

## 2018-07-31 DIAGNOSIS — M542 Cervicalgia: Secondary | ICD-10-CM | POA: Diagnosis not present

## 2018-07-31 DIAGNOSIS — Y998 Other external cause status: Secondary | ICD-10-CM | POA: Diagnosis not present

## 2018-07-31 DIAGNOSIS — W19XXXA Unspecified fall, initial encounter: Secondary | ICD-10-CM | POA: Diagnosis not present

## 2018-07-31 DIAGNOSIS — M25511 Pain in right shoulder: Secondary | ICD-10-CM | POA: Diagnosis not present

## 2018-07-31 DIAGNOSIS — R52 Pain, unspecified: Secondary | ICD-10-CM | POA: Diagnosis not present

## 2018-07-31 MED ORDER — SODIUM CHLORIDE 0.9 % IV BOLUS
1000.0000 mL | Freq: Once | INTRAVENOUS | Status: AC
  Administered 2018-07-31: 1000 mL via INTRAVENOUS

## 2018-07-31 MED ORDER — TETANUS-DIPHTH-ACELL PERTUSSIS 5-2.5-18.5 LF-MCG/0.5 IM SUSP
0.5000 mL | Freq: Once | INTRAMUSCULAR | Status: AC
  Administered 2018-07-31: 0.5 mL via INTRAMUSCULAR
  Filled 2018-07-31: qty 0.5

## 2018-07-31 MED ORDER — FENTANYL CITRATE (PF) 100 MCG/2ML IJ SOLN
75.0000 ug | Freq: Once | INTRAMUSCULAR | Status: DC
  Filled 2018-07-31: qty 2

## 2018-07-31 MED ORDER — LIDOCAINE-EPINEPHRINE 2 %-1:100000 IJ SOLN
INTRAMUSCULAR | Status: AC
  Filled 2018-07-31: qty 1

## 2018-07-31 MED ORDER — BACITRACIN-NEOMYCIN-POLYMYXIN 400-5-5000 EX OINT
TOPICAL_OINTMENT | Freq: Once | CUTANEOUS | Status: AC
  Administered 2018-07-31: 1 via TOPICAL
  Filled 2018-07-31: qty 1

## 2018-07-31 NOTE — ED Provider Notes (Signed)
[facility] Emergency Department Provider Note  ____________________________________________  Time seen: Approximately 2:43 PM  I have reviewed the triage vital signs and the nursing notes.   HISTORY  Chief Complaint Fall    HPI Cindy Perez is a 67 y.o. female, on a low-dose aspirin only, presenting for a fall.  The patient reports that she opened the door not realizing they were steps and lost her balance falling forward.  She reports striking her head resulting in a forehead laceration, and also has right shoulder and mild right elbow pain with right lateral neck pain.  She denies any numbness or tingling.  She was able to stand up and ambulate and denies any pain in her left upper extremity or legs.  She has no back pain.  She denies any associated chest pain, shortness of breath, palpitations, severe headache, nausea or vomiting  Past Medical History:  Diagnosis Date  . Arthritis    neck, hands, toes  . Depression   . Herpes   . Hypothyroidism   . Sinus trouble    occasional  . Vitamin D deficiency    takes supplement    Patient Active Problem List   Diagnosis Date Noted  . Colon polyps 08/02/2014    Past Surgical History:  Procedure Laterality Date  . COLONOSCOPY    . COLONOSCOPY WITH PROPOFOL N/A 08/02/2014   Procedure: COLONOSCOPY WITH PROPOFOL;  Surgeon: Lear Ng, MD;  Location: [facility];  Service: Endoscopy;  Laterality: N/A;  . DILATION AND CURETTAGE OF UTERUS    . EXCISION OF TONGUE LESION    . HOT HEMOSTASIS N/A 08/02/2014   Procedure: HOT HEMOSTASIS (ARGON PLASMA COAGULATION/BICAP);  Surgeon: Lear Ng, MD;  Location: [facility];  Service: Endoscopy;  Laterality: N/A;  . SHOULDER SURGERY Left    benign tumor removed  . TONSILLECTOMY    . WISDOM TOOTH EXTRACTION      Current Outpatient Rx  . Order #: [id number] Class: Historical Med  . Order #: [id number] Class: Historical Med  . Order #:  [id number] Class: Historical Med  . Order #: [id number] Class: Historical Med  . Order #: [id number] Class: Historical Med  . Order #: [id number] Class: Print  . Order #: [id number] Class: Historical Med    Allergies Bee venom; Shellfish allergy; Codeine; Sulfa antibiotics; and Influenza vaccines  No family history on file.  Social History Social History   Tobacco Use  . Smoking status: Former Smoker    Types: Cigarettes    Last attempt to quit: 07/19/1977    Years since quitting: 41.0  . Smokeless tobacco: Never Used  Substance Use Topics  . Alcohol use: Yes    Comment: occ 1 drink weekly  . Drug use: No    Review of Systems Constitutional: No fever/chills.  Positive mechanical fall.  Denies lightheadedness.  Denies loss of consciousness. Eyes: No visual changes.  No blurred or double vision. ENT: No dental injury.  No congestion or rhinorrhea. Cardiovascular: Denies chest pain. Denies palpitations. Respiratory: Denies shortness of breath.  No cough. Gastrointestinal: No abdominal pain.  No nausea, no vomiting.  No diarrhea.  No constipation. Genitourinary: Negative for dysuria. Musculoskeletal: Negative for back pain.  The right elbow and right shoulder pain.  Positive right lateral neck pain. Skin: Negative for rash.  Positive for head laceration. Neurological: Negative for headaches. No focal numbness, tingling or weakness.     ____________________________________________   PHYSICAL EXAM:  VITAL SIGNS: ED Triage Vitals  Enc Vitals Group     BP  07/31/18 1413 118/61     Pulse --      Resp 07/31/18 1414 13     Temp 07/31/18 1411 97.7 F (36.5 C)     Temp Source 07/31/18 1411 Oral     SpO2 --      Weight 07/31/18 1411 138 lb 0.1 oz (62.6 kg)     Height 07/31/18 1411 5\' 8"  (1.727 m)     Head Circumference --      Peak Flow --      Pain Score 07/31/18 1411 5     Pain Loc --      Pain Edu? --      Excl. in [place]? --     Constitutional: Alert and oriented. Answers  questions appropriately.  GCS is 15. Eyes: Conjunctivae are normal.  EOMI. PERRLA.  No scleral icterus.  No raccoon eyes. Head: 4 cm laceration just above the right eyebrow.  No battle sign.. Nose: No congestion/rhinnorhea.  No swelling over the nose or septal hematoma. Mouth/Throat: Mucous membranes are moist.  Neck: No stridor.  Supple.  Midline C-spine tenderness to palpation, step-offs or deformities.  The patient does have mild tenderness at the base of the neck and shoulder on the right side, which is worse with movement. Cardiovascular: Normal rate, regular rhythm. No murmurs, rubs or gallops.  Respiratory: Normal respiratory effort.  No accessory muscle use or retractions. Lungs CTAB.  No wheezes, rales or ronchi. Gastrointestinal: Soft, nontender and nondistended.  No guarding or rebound.  No peritoneal signs. Musculoskeletal: Pelvis is stable.  Full range of motion of the bilateral hips, knees and ankles without pain.  Full range of motion of the left shoulder, bilateral elbows and bilateral wrists without pain.  Patient does have pain with range of motion of the right shoulder, but no obvious deformity or concavity. Neurologic:  A&Ox3.  Speech is clear.  Face and smile are symmetric.  EOMI. PERRLA.  Moves all extremities well. Skin:  Skin is warm, dry.. No rash noted.  Laceration as described above.  In addition the patient has a 2 x 2 centimeter superficial abrasion on the elbow. Psychiatric: Mood and affect are normal.   ____________________________________________   LABS (all labs ordered are listed, but only abnormal results are displayed)  Labs Reviewed - No data to display ____________________________________________  EKG  ED ECG REPORT I, Anne-Caroline Mariea Clonts, the attending physician, personally viewed and interpreted this ECG.   Date: 07/31/2018  EKG Time: 1414  Rate: 60  Rhythm: normal sinus rhythm  Axis: leftward  Intervals:none  ST&T Change: No  STEMI  ____________________________________________  RADIOLOGY  No results found.  ____________________________________________   PROCEDURES  Procedure(s) performed: Yes, see procedure note(s).  Marland Kitchen.Laceration Repair Date/Time: 07/31/2018 2:43 PM Performed by: Eula Listen, MD Authorized by: Eula Listen, MD   Consent:    Consent obtained:  Verbal   Consent given by:  Patient   Risks discussed:  Infection, pain, retained foreign body, poor cosmetic result and poor wound healing Anesthesia (see MAR for exact dosages):    Anesthesia method:  Local infiltration   Local anesthetic:  Lidocaine 1% WITH epi Laceration details:    Location:  Face   Face location:  Forehead   Length (cm):  4 Repair type:    Repair type:  Simple Exploration:    Hemostasis achieved with:  Direct pressure   Wound exploration: entire depth of wound probed and visualized     Contaminated: no   Treatment:    Area  cleansed with:  Saline   Amount of cleaning:  Standard   Irrigation solution:  Sterile saline   Visualized foreign bodies/material removed: no   Skin repair:    Repair method:  Sutures   Suture size:  6-0   Suture material:  Prolene   Number of sutures:  5 Approximation:    Approximation:  Close Post-procedure details:    Dressing:  Sterile dressing and antibiotic ointment   Patient tolerance of procedure:  Tolerated well, no immediate complications    Critical Care performed: No ____________________________________________   INITIAL IMPRESSION / ASSESSMENT AND PLAN / ED COURSE  Pertinent labs & imaging results that were available during my care of the patient were reviewed by me and considered in my medical decision making (see chart for details).  67 y.o. female status post mechanical fall with resulting forehead laceration, lateral right neck pain, right shoulder pain and right elbow pain and abrasion.  Overall, the patient is hemodynamically stable.  The  patient describes a mechanical fall and there is no evidence of neurologic deficit.  We will get a CT of the head given her significant trauma to evaluate intracranial injury, CT of the C-spine, and x-rays of the right shoulder and right elbow.  The patient will be given Tdap but she is unsure whether she is up-to-date.  The patient will receive pain medication and intravenous fluids.  Her laceration has been repaired.  The patient's trauma work-up will be signed out to the oncoming physician who will follow up her results for final disposition.  ____________________________________________  FINAL CLINICAL IMPRESSION(S) / ED DIAGNOSES  Final diagnoses:  Laceration of forehead, initial encounter  Abrasion of right elbow, initial encounter  Neck pain on right side         NEW MEDICATIONS STARTED DURING THIS VISIT:  New Prescriptions   No medications on file      Eula Listen, MD 07/31/18 1455

## 2018-07-31 NOTE — Discharge Instructions (Addendum)
You may apply bacitracin, Neosporin or any triple antibiotic cream and a thick coat to your laceration as well as skin tear on the right elbow until they have healed.  You may shower with the sutures in your forehead but do not soak them.  Please have your primary care physician remove your sutures in 5 to 7 days.  Please continue to monitor your cuts for any sign of infection, including redness, swelling or pus drainage, and seek immediate attention if you notice any of the symptoms.  Return to the emergency department if you develop severe pain, lightheadedness or fainting, vomiting, numbness tingling or weakness, or any other symptoms concerning to you.

## 2018-07-31 NOTE — ED Triage Notes (Signed)
[facility]. Pt states that she thought she was walking into a closet but it was the furnace room and she fell down 3 steps. Pt has a deep laceration to the right eye brow, abrasion to the right arm, right shoulder pain, and T-spine tenderness. Pt is unsure about LOC. EMS reports that pt has been [facility] while in their care. Pt is in NAD at this time.

## 2018-07-31 NOTE — ED Provider Notes (Signed)
-----------------------------------------   3:48 PM on 07/31/2018 -----------------------------------------  I received signout on this patient from Dr. Mariea Clonts.  Her imaging is negative for ICH or acute fracture.  I reassessed her and she is comfortable.  She is stable for discharge at this time.  I discussed the results of the imaging with her.  Return precautions given, and the patient expresses understanding.   Arta Silence, MD 07/31/18 1549

## 2018-08-10 DIAGNOSIS — Z23 Encounter for immunization: Secondary | ICD-10-CM | POA: Diagnosis not present

## 2018-08-10 DIAGNOSIS — S46011D Strain of muscle(s) and tendon(s) of the rotator cuff of right shoulder, subsequent encounter: Secondary | ICD-10-CM | POA: Diagnosis not present

## 2018-08-10 DIAGNOSIS — W109XXD Fall (on) (from) unspecified stairs and steps, subsequent encounter: Secondary | ICD-10-CM | POA: Diagnosis not present

## 2018-08-10 DIAGNOSIS — S0181XD Laceration without foreign body of other part of head, subsequent encounter: Secondary | ICD-10-CM | POA: Diagnosis not present

## 2018-09-18 DIAGNOSIS — M75101 Unspecified rotator cuff tear or rupture of right shoulder, not specified as traumatic: Secondary | ICD-10-CM | POA: Diagnosis not present

## 2018-09-18 DIAGNOSIS — H8113 Benign paroxysmal vertigo, bilateral: Secondary | ICD-10-CM | POA: Diagnosis not present

## 2018-09-28 ENCOUNTER — Encounter: Payer: Self-pay | Admitting: Physical Therapy

## 2018-09-28 ENCOUNTER — Ambulatory Visit: Payer: Medicare PPO | Attending: Family Medicine | Admitting: Physical Therapy

## 2018-09-28 DIAGNOSIS — M25511 Pain in right shoulder: Secondary | ICD-10-CM | POA: Insufficient documentation

## 2018-09-28 NOTE — Therapy (Signed)
[facility] [address], [place], [address] Phone: [phone]   Fax:  [phone]  Physical Therapy Evaluation  Patient Details  Name: Cindy Perez MRN: [id number] Date of Birth: [birth year] Referring Provider (PT): Addison Lank   Encounter Date: 09/28/2018  PT End of Session - 09/28/18 1109    Visit Number  1    Number of Visits  17    Date for PT Re-Evaluation  11/23/18    PT Start Time  8299    PT Stop Time  1115    PT Time Calculation (min)  60 min    Activity Tolerance  Patient tolerated treatment well    Behavior During Therapy  [facility] for tasks assessed/performed       Past Medical History:  Diagnosis Date  . Arthritis    neck, hands, toes  . Depression   . Herpes   . Hypothyroidism   . Sinus trouble    occasional  . Vitamin D deficiency    takes supplement    Past Surgical History:  Procedure Laterality Date  . COLONOSCOPY    . COLONOSCOPY WITH PROPOFOL N/A 08/02/2014   Procedure: COLONOSCOPY WITH PROPOFOL;  Surgeon: Lear Ng, MD;  Location: [facility];  Service: Endoscopy;  Laterality: N/A;  . DILATION AND CURETTAGE OF UTERUS    . EXCISION OF TONGUE LESION    . HOT HEMOSTASIS N/A 08/02/2014   Procedure: HOT HEMOSTASIS (ARGON PLASMA COAGULATION/BICAP);  Surgeon: Lear Ng, MD;  Location: [facility];  Service: Endoscopy;  Laterality: N/A;  . SHOULDER SURGERY Left    benign tumor removed  . TONSILLECTOMY    . WISDOM TOOTH EXTRACTION      There were no vitals filed for this visit.   OBJECTIVE   Cervical Screen AROM: WFL with noted tension at R UT with L lateral bending Spurlings A (ipsilateral lateral flexion/axial compression): R: NEGATIVE L: NEGATIVE Spurlings B (ipsilateral lateral flexion/contralateral rotation/axial compression): R: NEGATIVE L: NEGATIVE ULTT Median: Negative bilat ULTT Ulnar: R: NEGATIVE L: NEGATIVE ULTT Radial: R:POSITIVE L: NEGATIVE  Elbow  Screen Elbow AROM: wnl b/l pain free  Palpation Noted tension L pec major/minor, UT, teres minor/major/lat, with some tenderness noted  Posture: forward head, rounded shoulder  Strength R/L 3/5 Shoulder flexion (anterior deltoid/pec major/coracobrachialis, axillary n. (C5-6) and musculocutaneous n. (C5-7)) 3-/5 Shoulder abduction (deltoid/supraspinatus, axillary/suprascapular n, C5) 4+/5 Shoulder external rotation (infraspinatus/teres minor) 4+/5 Shoulder internal rotation (subcapularis/lats/pec major) 4+/5 Shoulder extension (posterior deltoid, lats, teres major, axillary/thoracodorsal n.) 5/5 Elbow flexion (biceps brachii, brachialis, brachioradialis, musculoskeletal n, C5-6) 5/5 Elbow extension (triceps, radial n, C7)  AROM R/L 93*/180 Shoulder flexion 85*/180 Shoulder abduction C3*/C7 Shoulder external rotation S4*/T10 Shoulder internal rotation 57*/60 Shoulder extension *Indicates pain, overpressure performed unless otherwise indicated Tingling in middle finger with active flex and abd Most pain with IR  PROM R/L 141/180 Shoulder flexion 100/180 Shoulder abduction 55/90 Shoulder external rotation 70/70 Shoulder internal rotation 60/60 Shoulder extension *Indicates pain, overpressure performed unless otherwise indicated  Accessory Motions/Glides Glenohumeral: Posterior: L normal R guarding  Inferior: normal b/l  NEUROLOGICAL:  Mental Status Patient is oriented to person, place and time.  Recent memory is intact.  Remote memory is intact.  Attention span and concentration are intact.  Expressive speech is intact.  Patient's fund of knowledge is within normal limits for educational level.  Cranial Nerves Visual acuity and visual fields are intact  Extraocular muscles are intact  Facial sensation is intact bilaterally  Facial  strength is intact bilaterally  Hearing is normal as tested by gross conversation Palate elevates midline, normal phonation   Shoulder shrug strength is intact  Tongue protrudes midline   Sensation Grossly intact to light touch bilateral UE as determined by testing dermatomes C2-T2 Proprioception and hot/cold testing deferred on this date  SPECIAL TESTS  Rotator Cuff  Drop Arm Test: NEGATIVE bilat Painful Arc (Pain from 60 to 120 degrees scaption): R POSITIVE L negative Infraspinatus Test neg bilat  Subacromial Impingement Hawkins-Kennedy: R POSITIVE  Neer (Block scapula, PROM flexion): NEGATIVE bilat Empty Can: R POSITIVE L NEGATIVE   Labral Tear Biceps Load II (120 elevation, full ER, 90 elbow flexion, full supination, resisted elbow flexion): NEGATIVE bilat Crank (160 scaption, axial load with IR/ER): NEGATIVE bilat Active Compression Test: NEGATIVE bilat  Bicep Tendon Pathology Speed (shoulder flexion to 90, external rotation, full elbow extension, and forearm supination with resistance: NEGATIVE bilat Yergason's (resisted shoulder ER and supination/biceps tendon pathology): NEGATIVE bilat  Shoulder Instability Sulcus Sign: NEGATIVE bilat Anterior Apprehension: NEGATIVE bilat   Ther-Ex - Radial nerve glide x20 with concordant sign and pain relief  - Doorway pec stretch 30sec hold - Supine IR and ER stretch gravity assisted 30sec hold each - Pulleys AAROM abd and flex x10 3 sec hold - Education on muscle tension from guarding and correlated nerve pain       [facility] PT Assessment - 09/28/18 0001      Assessment   Medical Diagnosis  RTC syndrome    Referring Provider (PT)  Addison Lank    Onset Date/Surgical Date  07/31/18    Hand Dominance  Right    Next MD Visit  Feb    Prior Therapy  yes      Balance Screen   Has the patient fallen in the past 6 months  Yes    How many times?  1    Has the patient had a decrease in activity level because of a fear of falling?   No    Is the patient reluctant to leave their home because of a fear of falling?   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              PT Education - 09/28/18 1257    Education Details   Patient was educated on diagnosis, anatomy and pathology involved, prognosis, role of PT, and was given an HEP, demonstrating exercise with proper form following verbal and tactile cues, and was given a paper hand out to continue exercise at home. Pt was educated on and agreed to plan of care.    Person(s) Educated  Patient    Methods  Explanation;Demonstration;Tactile cues;Verbal cues  Comprehension  Verbalized understanding;Returned demonstration;Tactile cues required;Verbal cues required       PT Short Term Goals - 09/28/18 1223      PT SHORT TERM GOAL #1   Title  Pt will be independent with HEP in order to improve strength and decrease pain in order to improve pain-free function at home and work.    Time  4    Period  Weeks    Status  New        PT Long Term Goals - 09/28/18 1225      PT LONG TERM GOAL #1   Title  Pt will decrease worst pain as reported on NPRS by at least 3 points in order to demonstrate clinically significant reduction in pain.    Baseline  09/28/18 8/10 pain with movement    Time  8    Status  New      PT LONG TERM GOAL #2   Title  Patient will increase FOTO score to 60 to demonstrate predicted increase in functional mobility to complete ADLs    Baseline  09/28/18 41    Time  8    Period  Weeks    Status  New      PT  LONG TERM GOAL #3   Title  Patient will demonstrate symmetrical shoulder motion wnl to complete ADLS    Baseline  85/88/50 see eval    Time  8    Period  Weeks    Status  New      PT LONG TERM GOAL #4   Title  Patient will demonstrate symmetrical shoulder strength to complete household chores    Baseline  09/28/18 see eval    Time  8    Period  Weeks    Status  New             Plan - 09/28/18 1229    Clinical Impression Statement  Patient is a 67 year old female presenting with RTC pain following fall. Patient with impairments in L shoulder strength, ROM, guarding, sensation into L hand, and postural abnormalities. 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 Would benefit from skilled PT to address above deficits and promote optimal return to PLOF    History and Personal Factors relevant to plan of care:  post concussive syndrome    Clinical Presentation  Evolving    Clinical Presentation due to:  2 personal factors/comorbidities, 3 or more body systems/activity limitations/participation restrictions     Clinical Decision Making  Moderate    Rehab Potential  Good    Clinical Impairments Affecting Rehab Potential  (+) strong social support, motivation (-) age, sedentary lifestyle, other comorbidities    PT Frequency  2x / week    PT Duration  8 weeks    PT Treatment/Interventions  Electrical Stimulation;Traction;Ultrasound;Therapeutic activities;Neuromuscular re-education;Manual techniques;Energy conservation;Dry needling;Taping;Joint Manipulations;Spinal Manipulations;Patient/family education;Functional mobility training;Therapeutic exercise;Passive range of motion;Iontophoresis 4mg /ml Dexamethasone;ADLs/Self Care Home Management;Aquatic Therapy;Cryotherapy;Moist Heat    PT Next Visit Plan  Shoulder ROM, TDN?, periscapular  strengthening    PT Home Exercise Plan  pulleys abd and flex, supine ER/IR stretch, pec stretch, radial nerve glide    Consulted and Agree with Plan of Care  Patient       Patient will benefit from skilled therapeutic intervention in order to improve the following deficits and impairments:  Decreased activity tolerance, Decreased endurance,  Decreased range of motion, Decreased strength, Increased fascial restricitons, Impaired sensation, Impaired UE functional use, Improper body mechanics, Pain, Decreased mobility, Impaired flexibility, Impaired tone  Visit Diagnosis: Acute pain of right shoulder - Plan: PT plan of care cert/re-cert     Problem List Patient Active Problem List   Diagnosis Date Noted  . Colon polyps 08/02/2014   [name] PT, DPT [name] 09/28/2018, 1:02 PM  [facility] [address] [place], [address] Phone: [phone]   Fax:  [phone]  Name: Cindy Perez MRN: [id number] Date of Birth: 1950/10/09

## 2018-10-01 ENCOUNTER — Encounter: Payer: Self-pay | Admitting: Physical Therapy

## 2018-10-01 ENCOUNTER — Ambulatory Visit: Payer: Medicare PPO | Attending: Family Medicine | Admitting: Physical Therapy

## 2018-10-01 DIAGNOSIS — M25511 Pain in right shoulder: Secondary | ICD-10-CM | POA: Diagnosis not present

## 2018-10-01 NOTE — Therapy (Signed)
[place] [address] [place], [address] Phone: [phone]   Fax:  [phone]  Physical Therapy Treatment  Patient Details  Name: Cindy Perez MRN: [id number] Date of Birth: 09-06-1951 Referring Provider (PT): Addison Lank   Encounter Date: 10/01/2018  PT End of Session - 10/01/18 1136    Visit Number  2    Number of Visits  17    Date for PT Re-Evaluation  11/23/18    PT Start Time  1115    PT Stop Time  1200    PT Time Calculation (min)  45 min    Activity Tolerance  Patient tolerated treatment well    Behavior During Therapy  [facility] for tasks assessed/performed       Past Medical History:  Diagnosis Date  . Arthritis    neck, hands, toes  . Depression   . Herpes   . Hypothyroidism   . Sinus trouble    occasional  . Vitamin D deficiency    takes supplement    Past Surgical History:  Procedure Laterality Date  . COLONOSCOPY    . COLONOSCOPY WITH PROPOFOL N/A 08/02/2014   Procedure: COLONOSCOPY WITH PROPOFOL;  Surgeon: Lear Ng, MD;  Location: [facility];  Service: Endoscopy;  Laterality: N/A;  . DILATION AND CURETTAGE OF UTERUS    . EXCISION OF TONGUE LESION    . HOT HEMOSTASIS N/A 08/02/2014   Procedure: HOT HEMOSTASIS (ARGON PLASMA COAGULATION/BICAP);  Surgeon: Lear Ng, MD;  Location: [facility];  Service: Endoscopy;  Laterality: N/A;  . SHOULDER SURGERY Left    benign tumor removed  . TONSILLECTOMY    . WISDOM TOOTH EXTRACTION      There were no vitals filed for this visit.  Subjective Assessment - 10/01/18 1122    Subjective  Patient reports she is feeling "a little better since eval" and feels as though the "shoulder gliding is helping decrease her tingling sensation when it comes on", and that she is able to quilt. Patient reports 4/10 pain this am.     Pertinent History  Patient is a 68 year old female presenting with R shoulder pain. Patient reports she  openned a door at church that she didn't realize had a step and fell 07/31/18. Patient reports she continued to hold on to door handle with R hand while she fell inside. Patient reports she has had consistent pain since the fall that has only gotten a little better". Reports she also "hit her elbow but that is much better". Is concerned with pain that inhibits her ability to use her R hand, with increased pain with writing, quilting, and ADLs (washing hair, reaching behind, applying deorderant, folding laundry). 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 Patient also reports frequent trips to [place] to visit family. Worst pain in past week: 8/10 best: 2/10 in the R shoulder, with 2/10 constant pain in R hand which she reports has a tingling/burning sensation in her R palm into her middle and ring finger. Patient reports PMH of fall with resultant post-concussion syndrome June 2016. Pt denies N/V, unexplained weight fluctuation, saddle paresthesia, fever, B&B changes, ight sweats, or unrelenting night pain at this time.    Limitations  Lifting;House hold activities;Writing    How long can you sit comfortably?  unlimited    How long can you stand comfortably?  unlimited    How long can you walk comfortably?  unlimited  Diagnostic tests  Xray R shoulder and elbow with no significant findings, CT head and neck with no significant findings    Patient Stated Goals  Lift UE to be able to don/doff clothing, and wash self    Pain Onset  1 to 4 weeks ago       Ther-Ex - Cane AAROM IR/ER x20 with 3 sec hold - Pulleys x20 abd/flex 3sec hold; good carry over from eval -Supine pec stretch with towel roll 58min - Standing rows RTB x10; GTB 2x 10 with cuing initially for proper posture without shoulder hiking and scapular retraction with  - Lat pulldown 15# 3x 10 with demo and cuing for proper from with good carry over following  Manual - GHJ grade II AP  mobilization 4 bouts 30sec bouts progressed to grade III 4 bouts in neutral; 4 bouts moving into ER/4 into IR/4 into Abd for inc ROM - STM with trigger point release to R bicep, teres major/minor and UT                         PT Short Term Goals - 09/28/18 1223      PT SHORT TERM GOAL #1   Title  Pt will be independent with HEP in order to improve strength and decrease pain in order to improve pain-free function at home and work.    Time  4    Period  Weeks    Status  New        PT Long Term Goals - 09/28/18 1225      PT LONG TERM GOAL #1   Title  Pt will decrease worst pain as reported on NPRS by at least 3 points in order to demonstrate clinically significant reduction in pain.    Baseline  09/28/18 8/10 pain with movement    Time  8    Status  New      PT LONG TERM GOAL #2   Title  Patient will increase FOTO score to 60 to demonstrate predicted increase in functional mobility to complete ADLs    Baseline  09/28/18 41    Time  8    Period  Weeks    Status  New      PT LONG TERM GOAL #3   Title  Patient will demonstrate symmetrical shoulder motion wnl to complete ADLS    Baseline  85/02/77 see eval    Time  8    Period  Weeks    Status  New      PT LONG TERM GOAL #4   Title  Patient will demonstrate symmetrical shoulder strength to complete household chores    Baseline  09/28/18 see eval    Time  8    Period  Weeks    Status  New            Plan - 10/01/18 1408    Clinical Impression Statement  Patient is demonstrating inc shoulder mobility from eval with less gaurding allowing for increased tolerance of manual techniques. Patient is able to complete all therex with accuracy following PT demo and cuing. PT will continue progression for mobility/strength as able.     Rehab Potential  Good    Clinical Impairments Affecting Rehab Potential  (+) strong social support, motivation (-) age, sedentary lifestyle, other comorbidities    PT  Frequency  2x / week    PT Treatment/Interventions  Electrical Stimulation;Traction;Ultrasound;Therapeutic activities;Neuromuscular re-education;Manual techniques;Energy conservation;Dry needling;Taping;Joint Manipulations;Spinal Manipulations;Patient/family  education;Functional mobility training;Therapeutic exercise;Passive range of motion;Iontophoresis 4mg /ml Dexamethasone;ADLs/Self Care Home Management;Aquatic Therapy;Cryotherapy;Moist Heat    PT Next Visit Plan  Shoulder ROM, TDN?, periscapular strengthening    PT Home Exercise Plan  pulleys abd and flex, supine ER/IR stretch, pec stretch, radial nerve glide    Consulted and Agree with Plan of Care  Patient       Patient will benefit from skilled therapeutic intervention in order to improve the following deficits and impairments:  Decreased activity tolerance, Decreased endurance, Decreased range of motion, Decreased strength, Increased fascial restricitons, Impaired sensation, Impaired UE functional use, Improper body mechanics, Pain, Decreased mobility, Impaired flexibility, Impaired tone  Visit Diagnosis: Acute pain of right shoulder     Problem List Patient Active Problem List   Diagnosis Date Noted  . Colon polyps 08/02/2014   [name] PT, DPT [name] 10/01/2018, 2:11 PM   [place] [facility] [address], [place], [address] Phone: [phone]   Fax:  [phone]  Name: Cindy Perez MRN: [id number] Date of Birth: Oct 08, 1950

## 2018-10-05 ENCOUNTER — Encounter: Payer: Medicare Other | Admitting: Physical Therapy

## 2018-10-06 ENCOUNTER — Encounter: Payer: Self-pay | Admitting: Physical Therapy

## 2018-10-06 ENCOUNTER — Ambulatory Visit: Payer: Medicare PPO | Admitting: Physical Therapy

## 2018-10-06 DIAGNOSIS — M25511 Pain in right shoulder: Secondary | ICD-10-CM

## 2018-10-06 NOTE — Therapy (Signed)
[place] [facility] [address], [place], [address] Phone: [phone]   Fax:  [phone]  Physical Therapy Treatment  Patient Details  Name: Cindy Perez MRN: [id number] Date of Birth: Apr 29, 1951 Referring Provider (PT): Addison Lank   Encounter Date: 10/06/2018  PT End of Session - 10/06/18 4627    Visit Number  3  (Pended)     Number of Visits  80  (Pended)     Date for PT Re-Evaluation  11/23/18  (Pended)        Past Medical History:  Diagnosis Date  . Arthritis    neck, hands, toes  . Depression   . Herpes   . Hypothyroidism   . Sinus trouble    occasional  . Vitamin D deficiency    takes supplement    Past Surgical History:  Procedure Laterality Date  . COLONOSCOPY    . COLONOSCOPY WITH PROPOFOL N/A 08/02/2014   Procedure: COLONOSCOPY WITH PROPOFOL;  Surgeon: Lear Ng, MD;  Location: [facility];  Service: Endoscopy;  Laterality: N/A;  . DILATION AND CURETTAGE OF UTERUS    . EXCISION OF TONGUE LESION    . HOT HEMOSTASIS N/A 08/02/2014   Procedure: HOT HEMOSTASIS (ARGON PLASMA COAGULATION/BICAP);  Surgeon: Lear Ng, MD;  Location: [facility];  Service: Endoscopy;  Laterality: N/A;  . SHOULDER SURGERY Left    benign tumor removed  . TONSILLECTOMY    . WISDOM TOOTH EXTRACTION      There were no vitals filed for this visit.  Subjective Assessment - 10/06/18 1349    Subjective  Patient reports her shoulder was feeling better, but that her pain is worse today "with the rain" and reports he had a very busy weekend, and was "not as compliant with her HEP as she should have been". Reports 7/10 today.     Pertinent History  Patient is a 68 year old female presenting with R shoulder pain. Patient reports she openned a door at church that she didn't realize had a step and fell 07/31/18. Patient reports she continued to hold on to door handle with R hand while she fell inside. Patient  reports she has had consistent pain since the fall that has only gotten a little better". Reports she also "hit her elbow but that is much better". Is concerned with pain that inhibits her ability to use her R hand, with increased pain with writing, quilting, and ADLs (washing hair, reaching behind, applying deorderant, folding laundry). 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 Patient also reports frequent trips to [place] to visit family. Worst pain in past week: 8/10 best: 2/10 in the R shoulder, with 2/10 constant pain in R hand which she reports has a tingling/burning sensation in her R palm into her middle and ring finger. Patient reports PMH of fall with resultant post-concussion syndrome June 2016. Pt denies N/V, unexplained weight fluctuation, saddle paresthesia, fever, B&B changes, ight sweats, or unrelenting night pain at this time.    Limitations  Lifting;House hold activities;Writing    How long can you sit comfortably?  unlimited    How long can you stand comfortably?  unlimited    How long can you walk comfortably?  unlimited    Diagnostic tests  Xray R shoulder and elbow with no significant findings, CT head and neck with no significant findings    Patient Stated Goals  Lift UE to be able to don/doff clothing,  and wash self    Pain Onset  1 to 4 weeks ago         Ther-Ex - Cane AAROM IR/ER x20 with 3 sec hold - Pulleys x20 abd/flex 3sec hold; good carry over from eval - Standing rows 5# 3x 10 with min cuing to reduce shoulder hiking with good carry over following - Lat pulldown 20# 3x 10 with good carry over from previous session - Scaption with RTB 2x 10 - UT stretch 30sec hold (HEP)  Manual - GHJ grade II AP mobilization 4 bouts 30sec bouts progressed to grade III 4 bouts in neutral; 4 bouts moving into ER/4 into IR/4 into Abd for inc ROM - STM with trigger point release to R pec minor, teres major/minor tspine  paraspinals, and UT                       PT Education - 10/06/18 1354    Education Details  Exercise form, TDN education    Person(s) Educated  Patient    Methods  Explanation;Demonstration;Verbal cues    Comprehension  Verbalized understanding;Returned demonstration;Verbal cues required       PT Short Term Goals - 09/28/18 1223      PT SHORT TERM GOAL #1   Title  Pt will be independent with HEP in order to improve strength and decrease pain in order to improve pain-free function at home and work.    Time  4    Period  Weeks    Status  New        PT Long Term Goals - 09/28/18 1225      PT LONG TERM GOAL #1   Title  Pt will decrease worst pain as reported on NPRS by at least 3 points in order to demonstrate clinically significant reduction in pain.    Baseline  09/28/18 8/10 pain with movement    Time  8    Status  New      PT LONG TERM GOAL #2   Title  Patient will increase FOTO score to 60 to demonstrate predicted increase in functional mobility to complete ADLs    Baseline  09/28/18 41    Time  8    Period  Weeks    Status  New      PT LONG TERM GOAL #3   Title  Patient will demonstrate symmetrical shoulder motion wnl to complete ADLS    Baseline  12/75/17 see eval    Time  8    Period  Weeks    Status  New      PT LONG TERM GOAL #4   Title  Patient will demonstrate symmetrical shoulder strength to complete household chores    Baseline  09/28/18 see eval    Time  8    Period  Weeks    Status  New            Plan - 10/06/18 1457    Clinical Impression Statement  PT educated patient on benefits (and risks) of TDN, with good localized twitch responses and decreased shoulder height following. Patient is demonstrating good postural carry over, with min cuing for proper form with therex. Patient with inc AROM following manual and TDN techniques as well.     Rehab Potential  Good    Clinical Impairments Affecting Rehab Potential  (+) strong  social support, motivation (-) age, sedentary lifestyle, other comorbidities    PT Frequency  2x / week  PT Duration  8 weeks    PT Treatment/Interventions  Electrical Stimulation;Traction;Ultrasound;Therapeutic activities;Neuromuscular re-education;Manual techniques;Energy conservation;Dry needling;Taping;Joint Manipulations;Spinal Manipulations;Patient/family education;Functional mobility training;Therapeutic exercise;Passive range of motion;Iontophoresis 4mg /ml Dexamethasone;ADLs/Self Care Home Management;Aquatic Therapy;Cryotherapy;Moist Heat    PT Next Visit Plan  Shoulder ROM, TDN?, periscapular strengthening    PT Home Exercise Plan  pulleys abd and flex, supine ER/IR stretch, pec stretch, UT stretch, tband scaption, radial nerve glide    Consulted and Agree with Plan of Care  Patient       Patient will benefit from skilled therapeutic intervention in order to improve the following deficits and impairments:  Decreased activity tolerance, Decreased endurance, Decreased range of motion, Decreased strength, Increased fascial restricitons, Impaired sensation, Impaired UE functional use, Improper body mechanics, Pain, Decreased mobility, Impaired flexibility, Impaired tone  Visit Diagnosis: Acute pain of right shoulder     Problem List Patient Active Problem List   Diagnosis Date Noted  . Colon polyps 08/02/2014   [name] PT, DPT [name] 10/06/2018, 3:01 PM  [place] [address], [place], [address] Phone: [phone]   Fax:  [phone]  Name: Cindy Perez MRN: [id number] Date of Birth: 08/19/1951

## 2018-10-07 ENCOUNTER — Encounter: Payer: Medicare Other | Admitting: Physical Therapy

## 2018-10-08 ENCOUNTER — Ambulatory Visit: Payer: Medicare PPO | Admitting: Physical Therapy

## 2018-10-08 ENCOUNTER — Encounter: Payer: Self-pay | Admitting: Physical Therapy

## 2018-10-08 DIAGNOSIS — M25511 Pain in right shoulder: Secondary | ICD-10-CM | POA: Diagnosis not present

## 2018-10-08 NOTE — Therapy (Signed)
[place] [facility] [address] [place], [address] Phone: [phone]   Fax:  [phone]  Physical Therapy Treatment  Patient Details  Name: Cindy Perez MRN: [id number] Date of Birth: 1950-10-22 Referring Provider (PT): Addison Lank   Encounter Date: 10/08/2018  PT End of Session - 10/08/18 1029    Visit Number  4    Number of Visits  17    Date for PT Re-Evaluation  11/23/18    PT Start Time  0945    PT Stop Time  1025    PT Time Calculation (min)  40 min    Activity Tolerance  Patient tolerated treatment well    Behavior During Therapy  [facility] for tasks assessed/performed       Past Medical History:  Diagnosis Date  . Arthritis    neck, hands, toes  . Depression   . Herpes   . Hypothyroidism   . Sinus trouble    occasional  . Vitamin D deficiency    takes supplement    Past Surgical History:  Procedure Laterality Date  . COLONOSCOPY    . COLONOSCOPY WITH PROPOFOL N/A 08/02/2014   Procedure: COLONOSCOPY WITH PROPOFOL;  Surgeon: Lear Ng, MD;  Location: [facility];  Service: Endoscopy;  Laterality: N/A;  . DILATION AND CURETTAGE OF UTERUS    . EXCISION OF TONGUE LESION    . HOT HEMOSTASIS N/A 08/02/2014   Procedure: HOT HEMOSTASIS (ARGON PLASMA COAGULATION/BICAP);  Surgeon: Lear Ng, MD;  Location: [facility];  Service: Endoscopy;  Laterality: N/A;  . SHOULDER SURGERY Left    benign tumor removed  . TONSILLECTOMY    . WISDOM TOOTH EXTRACTION      There were no vitals filed for this visit.  Subjective Assessment - 10/08/18 0951    Subjective  Patient reports decreased pain following TDN last session, reporting 5/10 pain today with decreased "tingling" sensation. Compliance with HEP with no questions or concerns.     Pertinent History  Patient is a 68 year old female presenting with R shoulder pain. Patient reports she openned a door at church that she didn't realize had a  step and fell 07/31/18. Patient reports she continued to hold on to door handle with R hand while she fell inside. Patient reports she has had consistent pain since the fall that has only gotten a little better". Reports she also "hit her elbow but that is much better". Is concerned with pain that inhibits her ability to use her R hand, with increased pain with writing, quilting, and ADLs (washing hair, reaching behind, applying deorderant, folding laundry). 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 Patient also reports frequent trips to [place] to visit family. Worst pain in past week: 8/10 best: 2/10 in the R shoulder, with 2/10 constant pain in R hand which she reports has a tingling/burning sensation in her R palm into her middle and ring finger. Patient reports PMH of fall with resultant post-concussion syndrome June 2016. Pt denies N/V, unexplained weight fluctuation, saddle paresthesia, fever, B&B changes, ight sweats, or unrelenting night pain at this time.    Limitations  Lifting;House hold activities;Writing    How long can you sit comfortably?  unlimited    How long can you stand comfortably?  unlimited    How long can you walk comfortably?  unlimited    Diagnostic tests  Xray R shoulder and elbow with no significant findings, CT head  and neck with no significant findings    Patient Stated Goals  Lift UE to be able to don/doff clothing, and wash self    Pain Onset  1 to 4 weeks ago            Ther-Ex - Pulleys x20 abd/flex 3sec hold; good carry over from eval - Standing rows 5# 3x 10 with min cuing to reduce shoulder hiking with good carry over following - Low row with elbow ext RTB 3x 10 with min cuing for scapular retraction with good carry over following -  AAROM IR with towel x20 with 3 sec hold - Review of HEP thus far: (low rows, AAROM IR with towel, AAROM ER with cane/broom, pulleys abd and flex, scaption) as patient will be  on vacation next week    Manual - GHJ gradeIIAPmobilization4 bouts 30sec bouts progressed to grade III 4 bouts in neutral; 4 bouts moving into ER/4 into IR/4 into Abd for inc ROM - STM withtrigger point releaseto R pec minor, teres major/minor/latissimus, tspine paraspinals, and UT Following: (6) 39mm .30 needles placed along the R UT (pincer grasp patient in prone), R rhomboid (shelving into scapula pt in prone), pec minor andteres minor/latissimus (pt in supine using pincer graspto decrease increased muscular spasms and trigger points with the patient positioned in prone.                      PT Education - 10/08/18 1023    Education Details  Exercise form/technique, postural cuing    Person(s) Educated  Patient    Methods  Explanation;Demonstration;Tactile cues;Verbal cues    Comprehension  Verbalized understanding;Returned demonstration;Verbal cues required;Tactile cues required       PT Short Term Goals - 09/28/18 1223      PT SHORT TERM GOAL #1   Title  Pt will be independent with HEP in order to improve strength and decrease pain in order to improve pain-free function at home and work.    Time  4    Period  Weeks    Status  New        PT Long Term Goals - 09/28/18 1225      PT LONG TERM GOAL #1   Title  Pt will decrease worst pain as reported on NPRS by at least 3 points in order to demonstrate clinically significant reduction in pain.    Baseline  09/28/18 8/10 pain with movement    Time  8    Status  New      PT LONG TERM GOAL #2   Title  Patient will increase FOTO score to 60 to demonstrate predicted increase in functional mobility to complete ADLs    Baseline  09/28/18 41    Time  8    Period  Weeks    Status  New      PT LONG TERM GOAL #3   Title  Patient will demonstrate symmetrical shoulder motion wnl to complete ADLS    Baseline  47/42/59 see eval    Time  8    Period  Weeks    Status  New      PT LONG TERM GOAL #4   Title   Patient will demonstrate symmetrical shoulder strength to complete household chores    Baseline  09/28/18 see eval    Time  8    Period  Weeks    Status  New  Plan - 10/08/18 1048    Clinical Impression Statement  PT continued TDN + manual techniques for pain management and to decrease muscle tension. Patient continues to report decreased pain following with noted decreased soft tissue tension with better postural compliance following. PT continued therex progression, with patient requiring cuing for accuracy with good carry over following. PT also reviewed HEP thus far for compliance over vacation next week; patient verbalized and demonstrated understanding     Rehab Potential  Good    Clinical Impairments Affecting Rehab Potential  (+) strong social support, motivation (-) age, sedentary lifestyle, other comorbidities    PT Frequency  2x / week    PT Duration  8 weeks    PT Treatment/Interventions  Electrical Stimulation;Traction;Ultrasound;Therapeutic activities;Neuromuscular re-education;Manual techniques;Energy conservation;Dry needling;Taping;Joint Manipulations;Spinal Manipulations;Patient/family education;Functional mobility training;Therapeutic exercise;Passive range of motion;Iontophoresis 4mg /ml Dexamethasone;ADLs/Self Care Home Management;Aquatic Therapy;Cryotherapy;Moist Heat    PT Next Visit Plan  Shoulder ROM, TDN?, periscapular strengthening    PT Home Exercise Plan  pulleys abd and flex, supine ER/IR stretch, pec stretch, UT stretch, tband scaption, radial nerve glide    Consulted and Agree with Plan of Care  Patient       Patient will benefit from skilled therapeutic intervention in order to improve the following deficits and impairments:  Decreased activity tolerance, Decreased endurance, Decreased range of motion, Decreased strength, Increased fascial restricitons, Impaired sensation, Impaired UE functional use, Improper body mechanics, Pain, Decreased  mobility, Impaired flexibility, Impaired tone  Visit Diagnosis: Acute pain of right shoulder     Problem List Patient Active Problem List   Diagnosis Date Noted  . Colon polyps 08/02/2014   [name] PT, DPT [name] 10/08/2018, 10:56 AM  [place] [facility] [address], [place], [address] Phone: [phone]   Fax:  [phone]  Name: Cindy Perez MRN: [id number] Date of Birth: [birth year]

## 2018-10-20 ENCOUNTER — Encounter: Payer: Self-pay | Admitting: Physical Therapy

## 2018-10-20 ENCOUNTER — Ambulatory Visit: Payer: Medicare PPO | Admitting: Physical Therapy

## 2018-10-20 DIAGNOSIS — M25511 Pain in right shoulder: Secondary | ICD-10-CM | POA: Diagnosis not present

## 2018-10-20 NOTE — Therapy (Signed)
[place] [facility] [address] [place], [address] Phone: [phone]   Fax:  [phone]  Physical Therapy Treatment  Patient Details  Name: Cindy Perez MRN: [id number] Date of Birth: [birth year] Referring Provider (PT): Addison Lank   Encounter Date: 10/20/2018  PT End of Session - 10/20/18 1015    Visit Number  5    Number of Visits  17    Date for PT Re-Evaluation  11/23/18    PT Start Time  0945    PT Stop Time  1030    PT Time Calculation (min)  45 min    Activity Tolerance  Patient tolerated treatment well    Behavior During Therapy  [facility] for tasks assessed/performed       Past Medical History:  Diagnosis Date  . Arthritis    neck, hands, toes  . Depression   . Herpes   . Hypothyroidism   . Sinus trouble    occasional  . Vitamin D deficiency    takes supplement    Past Surgical History:  Procedure Laterality Date  . COLONOSCOPY    . COLONOSCOPY WITH PROPOFOL N/A 08/02/2014   Procedure: COLONOSCOPY WITH PROPOFOL;  Surgeon: Lear Ng, MD;  Location: [facility];  Service: Endoscopy;  Laterality: N/A;  . DILATION AND CURETTAGE OF UTERUS    . EXCISION OF TONGUE LESION    . HOT HEMOSTASIS N/A 08/02/2014   Procedure: HOT HEMOSTASIS (ARGON PLASMA COAGULATION/BICAP);  Surgeon: Lear Ng, MD;  Location: [facility];  Service: Endoscopy;  Laterality: N/A;  . SHOULDER SURGERY Left    benign tumor removed  . TONSILLECTOMY    . WISDOM TOOTH EXTRACTION      There were no vitals filed for this visit.  Subjective Assessment - 10/20/18 0951    Subjective  Patient reports decreased pain following last session for a few days, which she is very pleased with. Patient reports some compliance with HEP over her vacation, but "not as much as she should". Patient reports 5/10 pain today, but reports she does feel as though her motion is getting better.     Pertinent History  Patient is a 68  year old female presenting with R shoulder pain. Patient reports she openned a door at church that she didn't realize had a step and fell 07/31/18. Patient reports she continued to hold on to door handle with R hand while she fell inside. Patient reports she has had consistent pain since the fall that has only gotten a little better". Reports she also "hit her elbow but that is much better". Is concerned with pain that inhibits her ability to use her R hand, with increased pain with writing, quilting, and ADLs (washing hair, reaching behind, applying deorderant, folding laundry). 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 Patient also reports frequent trips to [place] to visit family. Worst pain in past week: 8/10 best: 2/10 in the R shoulder, with 2/10 constant pain in R hand which she reports has a tingling/burning sensation in her R palm into her middle and ring finger. Patient reports PMH of fall with resultant post-concussion syndrome June 2016. Pt denies N/V, unexplained weight fluctuation, saddle paresthesia, fever, B&B changes, ight sweats, or unrelenting night pain at this time.    Limitations  Lifting;House hold activities;Writing    How long can you sit comfortably?  unlimited    How long can you stand comfortably?  unlimited  How long can you walk comfortably?  unlimited    Diagnostic tests  Xray R shoulder and elbow with no significant findings, CT head and neck with no significant findings    Patient Stated Goals  Lift UE to be able to don/doff clothing, and wash self    Pain Onset  1 to 4 weeks ago       Ther-Ex - Pulleys x20 abd/flex 3sec hold; good carry over from eval - Seated ER with dowel and TC with towel under elbow to prevent abd x20 - Standing IR AAROM with towel and cuing to neutral wrist alignment x20 - Standing rowsBTB 3x 10 with min TC for scapular retraction with good carry over from previous session - Standing ER  and IR RTB with cuing initially for proper form and eccentric control with good carry over following - Lat Pulldowns 20# x12; 25# 2x 10 with good carry over from previous session with increased periscapular control   Manual - STM withtrigger point releaseto Rpec minor, teres major/minor/latissimus,tspine paraspinals,and UT Following: (6) 75mm .30 needles placed along the R UT (pincer grasp patient in prone), R rhomboid (shelving into scapula pt in prone), pec minor andteres minor/latissimus (pt in supine using pincer graspto decrease increased muscular spasms and trigger points with the patient positioned in prone.   Pain following session 2/10                     PT Education - 10/20/18 1013    Education Details  Exercise form    Person(s) Educated  Patient    Methods  Explanation;Demonstration;Tactile cues;Verbal cues    Comprehension  Verbalized understanding;Returned demonstration;Verbal cues required;Tactile cues required       PT Short Term Goals - 09/28/18 1223      PT SHORT TERM GOAL #1   Title  Pt will be independent with HEP in order to improve strength and decrease pain in order to improve pain-free function at home and work.    Time  4    Period  Weeks    Status  New        PT Long Term Goals - 09/28/18 1225      PT LONG TERM GOAL #1   Title  Pt will decrease worst pain as reported on NPRS by at least 3 points in order to demonstrate clinically significant reduction in pain.    Baseline  09/28/18 8/10 pain with movement    Time  8    Status  New      PT LONG TERM GOAL #2   Title  Patient will increase FOTO score to 60 to demonstrate predicted increase in functional mobility to complete ADLs    Baseline  09/28/18 41    Time  8    Period  Weeks    Status  New      PT LONG TERM GOAL #3   Title  Patient will demonstrate symmetrical shoulder motion wnl to complete ADLS    Baseline  02/63/78 see eval    Time  8    Period  Weeks     Status  New      PT LONG TERM GOAL #4   Title  Patient will demonstrate symmetrical shoulder strength to complete household chores    Baseline  09/28/18 see eval    Time  8    Period  Weeks    Status  New  Plan - 10/20/18 1028    Clinical Impression Statement  Pt continued TDN+ manual techniques to decrease pain and soft tissue tension. Following mobility and strength exercise pt had a decrease of pain to 2/10 NPS with improved right shoulder motion. Pt required minimal cueing for exercise. Pt can continue to benefit from skilled treatment in order to continue to decrease shoulder pain and to improve shoulder mobility/strenght to return to prior level of function.     Rehab Potential  Good    Clinical Impairments Affecting Rehab Potential  (+) strong social support, motivation (-) age, sedentary lifestyle, other comorbidities    PT Frequency  2x / week    PT Duration  8 weeks    PT Treatment/Interventions  Electrical Stimulation;Traction;Ultrasound;Therapeutic activities;Neuromuscular re-education;Manual techniques;Energy conservation;Dry needling;Taping;Joint Manipulations;Spinal Manipulations;Patient/family education;Functional mobility training;Therapeutic exercise;Passive range of motion;Iontophoresis 4mg /ml Dexamethasone;ADLs/Self Care Home Management;Aquatic Therapy;Cryotherapy;Moist Heat    PT Next Visit Plan  Shoulder ROM, TDN?, periscapular strengthening    PT Home Exercise Plan  pulleys abd and flex, supine ER/IR stretch, pec stretch, UT stretch, tband scaption, radial nerve glide    Consulted and Agree with Plan of Care  Patient       Patient will benefit from skilled therapeutic intervention in order to improve the following deficits and impairments:  Decreased activity tolerance, Decreased endurance, Decreased range of motion, Decreased strength, Increased fascial restricitons, Impaired sensation, Impaired UE functional use, Improper body mechanics, Pain, Decreased  mobility, Impaired flexibility, Impaired tone  Visit Diagnosis: Acute pain of right shoulder     Problem List Patient Active Problem List   Diagnosis Date Noted  . Colon polyps 08/02/2014   [name] PT, DPT [name] 10/20/2018, 10:37 AM  [place] [facility] [address] [place], [address] Phone: [phone]   Fax:  [phone]  Name: Shadasia Oldfield MRN: [id number] Date of Birth: 1951-06-09

## 2018-10-22 ENCOUNTER — Ambulatory Visit: Payer: Medicare PPO | Admitting: Physical Therapy

## 2018-10-22 ENCOUNTER — Encounter: Payer: Self-pay | Admitting: Physical Therapy

## 2018-10-22 DIAGNOSIS — M25511 Pain in right shoulder: Secondary | ICD-10-CM | POA: Diagnosis not present

## 2018-10-22 NOTE — Therapy (Signed)
[place] [facility] [address], [place], [address] Phone: [phone]   Fax:  [phone]  Physical Therapy Treatment  Patient Details  Name: Cindy Perez MRN: [id number] Date of Birth: 1951-06-28 Referring Provider (PT): Addison Lank   Encounter Date: 10/22/2018  PT End of Session - 10/22/18 1038    Visit Number  6    Number of Visits  17    Date for PT Re-Evaluation  11/23/18    PT Start Time  0945    PT Stop Time  1030    PT Time Calculation (min)  45 min    Activity Tolerance  Patient tolerated treatment well    Behavior During Therapy  [facility] for tasks assessed/performed       Past Medical History:  Diagnosis Date  . Arthritis    neck, hands, toes  . Depression   . Herpes   . Hypothyroidism   . Sinus trouble    occasional  . Vitamin D deficiency    takes supplement    Past Surgical History:  Procedure Laterality Date  . COLONOSCOPY    . COLONOSCOPY WITH PROPOFOL N/A 08/02/2014   Procedure: COLONOSCOPY WITH PROPOFOL;  Surgeon: Lear Ng, MD;  Location: [facility];  Service: Endoscopy;  Laterality: N/A;  . DILATION AND CURETTAGE OF UTERUS    . EXCISION OF TONGUE LESION    . HOT HEMOSTASIS N/A 08/02/2014   Procedure: HOT HEMOSTASIS (ARGON PLASMA COAGULATION/BICAP);  Surgeon: Lear Ng, MD;  Location: [facility];  Service: Endoscopy;  Laterality: N/A;  . SHOULDER SURGERY Left    benign tumor removed  . TONSILLECTOMY    . WISDOM TOOTH EXTRACTION      There were no vitals filed for this visit.  Subjective Assessment - 10/22/18 0952    Subjective  Patient reports increased pain this am 4/10 with tension at R UT. Patient reports she did complete chair yoga yesterday and completing "more weight exercises". Patient reports compliance with her HEP with no questions or concerns.     Pertinent History  Patient is a 68 year old female presenting with R shoulder pain. Patient reports  she openned a door at church that she didn't realize had a step and fell 07/31/18. Patient reports she continued to hold on to door handle with R hand while she fell inside. Patient reports she has had consistent pain since the fall that has only gotten a little better". Reports she also "hit her elbow but that is much better". Is concerned with pain that inhibits her ability to use her R hand, with increased pain with writing, quilting, and ADLs (washing hair, reaching behind, applying deorderant, folding laundry). 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 Patient also reports frequent trips to [place] to visit family. Worst pain in past week: 8/10 best: 2/10 in the R shoulder, with 2/10 constant pain in R hand which she reports has a tingling/burning sensation in her R palm into her middle and ring finger. Patient reports PMH of fall with resultant post-concussion syndrome June 2016. Pt denies N/V, unexplained weight fluctuation, saddle paresthesia, fever, B&B changes, ight sweats, or unrelenting night pain at this time.    Limitations  Lifting;House hold activities;Writing    How long can you sit comfortably?  unlimited    How long can you stand comfortably?  unlimited    How long can you walk comfortably?  unlimited    Diagnostic tests  Xray R shoulder and elbow with no significant findings, CT head and neck with no significant findings    Patient Stated Goals  Lift UE to be able to don/doff clothing, and wash self    Pain Onset  1 to 4 weeks ago       Ther-Ex - Pulleys x20 abd/flex 3sec hold; good carry over from eval  Manual - STM withtrigger point releaseto Rpec minor, teres major/minor/latissimus,and UT. Later, middle deltoid Following:(6) 15mm .30 needles placed along theR UT (pincer grasp patient in prone),R rhomboid(shelving into scapula pt in prone), pec minorandmiddle deltoid with pincer grasp, to reduce spasms and trigger  points with the patient positioned in prone. Multiple bouts of PROM in all planes with patient reporting she feels limited by (localizes pain to middle deltoid). PT performed STM with trigger pointing and TDN to noted trigger points here with localized twitch response and patient's PROM improved over more bouts with 3sec holds at end range.  PT then educated patient on pain science and following this patient demonstrates full PROM in all directions, with some difficulty in end range IR. Following pain education patient demonstrates full active ROM for the first time through sessions saying outloud "I am okay" which patient and PT are very pleased with.  Pain following session 2/10 with full AROM  NeuroMuscular ReEd Education on brain-body connection and muscle guarding as a fight-or-flight response, that can become mal-adaptive and led to chronic pain and guarding when the body is not under "threat". PT educated patient on bottom-up approach to attempt to change pain perception from the brain, with understanding noted from patient and good physical success with full AROM.                        PT Education - 10/22/18 0954    Education Details  Exercise technique    Person(s) Educated  Patient    Methods  Explanation;Demonstration;Verbal cues    Comprehension  Verbalized understanding;Returned demonstration;Verbal cues required       PT Short Term Goals - 09/28/18 1223      PT SHORT TERM GOAL #1   Title  Pt will be independent with HEP in order to improve strength and decrease pain in order to improve pain-free function at home and work.    Time  4    Period  Weeks    Status  New        PT Long Term Goals - 09/28/18 1225      PT LONG TERM GOAL #1   Title  Pt will decrease worst pain as reported on NPRS by at least 3 points in order to demonstrate clinically significant reduction in pain.    Baseline  09/28/18 8/10 pain with movement    Time  8    Status  New       PT LONG TERM GOAL #2   Title  Patient will increase FOTO score to 60 to demonstrate predicted increase in functional mobility to complete ADLs    Baseline  09/28/18 41    Time  8    Period  Weeks    Status  New      PT LONG TERM GOAL #3   Title  Patient will demonstrate symmetrical shoulder motion wnl to complete ADLS    Baseline  73/22/02 see eval    Time  8    Period  Weeks    Status  New      PT  LONG TERM GOAL #4   Title  Patient will demonstrate symmetrical shoulder strength to complete household chores    Baseline  09/28/18 see eval    Time  8    Period  Weeks    Status  New            Plan - 10/22/18 1107    Clinical Impression Statement  Patient with increased muscle tension this session, with gaurding/tension noted at middle deltoid. Follow TDN/manual techniques and pain education patient is able to demonstrate full active flex and abd, and near full IR and ER for the first time with this PT. Patient is very excited by this and PT encouraged patient to continue AROM over the weekend in addition to HEP to maintain session gains.     Clinical Impairments Affecting Rehab Potential  (+) strong social support, motivation (-) age, sedentary lifestyle, other comorbidities    PT Frequency  2x / week    PT Duration  8 weeks    PT Treatment/Interventions  Electrical Stimulation;Traction;Ultrasound;Therapeutic activities;Neuromuscular re-education;Manual techniques;Energy conservation;Dry needling;Taping;Joint Manipulations;Spinal Manipulations;Patient/family education;Functional mobility training;Therapeutic exercise;Passive range of motion;Iontophoresis 4mg /ml Dexamethasone;ADLs/Self Care Home Management;Aquatic Therapy;Cryotherapy;Moist Heat    PT Next Visit Plan  Shoulder ROM, TDN, periscapular strengthening    PT Home Exercise Plan  pulleys abd and flex, supine ER/IR stretch, pec stretch, UT stretch, tband scaption, radial nerve glide    Consulted and Agree with Plan of Care   Patient       Patient will benefit from skilled therapeutic intervention in order to improve the following deficits and impairments:  Decreased activity tolerance, Decreased endurance, Decreased range of motion, Decreased strength, Increased fascial restricitons, Impaired sensation, Impaired UE functional use, Improper body mechanics, Pain, Decreased mobility, Impaired flexibility, Impaired tone  Visit Diagnosis: Acute pain of right shoulder     Problem List Patient Active Problem List   Diagnosis Date Noted  . Colon polyps 08/02/2014   [name] PT, DPT [name] 10/22/2018, 11:42 AM  [place] [facility] [address], [place], [address] Phone: [phone]   Fax:  [phone]  Name: Cindy Perez MRN: [id number] Date of Birth: May 05, 1951

## 2018-10-27 ENCOUNTER — Encounter: Payer: Self-pay | Admitting: Physical Therapy

## 2018-10-27 ENCOUNTER — Ambulatory Visit: Payer: Medicare PPO | Admitting: Physical Therapy

## 2018-10-27 DIAGNOSIS — M25511 Pain in right shoulder: Secondary | ICD-10-CM | POA: Diagnosis not present

## 2018-10-27 NOTE — Therapy (Addendum)
[place] [facility] [address] [place], [address] Phone: [phone]   Fax:  [phone]  Physical Therapy Treatment  Patient Details  Name: Cindy Perez MRN: [id number] Date of Birth: December 09, 1950 Referring Provider (PT): Addison Lank   Encounter Date: 10/27/2018  PT End of Session - 10/27/18 1010    Visit Number  7    Number of Visits  17    Date for PT Re-Evaluation  11/23/18    PT Start Time  0945    PT Stop Time  1030    PT Time Calculation (min)  45 min    Activity Tolerance  Patient tolerated treatment well    Behavior During Therapy  [facility] for tasks assessed/performed       Past Medical History:  Diagnosis Date  . Arthritis    neck, hands, toes  . Depression   . Herpes   . Hypothyroidism   . Sinus trouble    occasional  . Vitamin D deficiency    takes supplement    Past Surgical History:  Procedure Laterality Date  . COLONOSCOPY    . COLONOSCOPY WITH PROPOFOL N/A 08/02/2014   Procedure: COLONOSCOPY WITH PROPOFOL;  Surgeon: Lear Ng, MD;  Location: [facility];  Service: Endoscopy;  Laterality: N/A;  . DILATION AND CURETTAGE OF UTERUS    . EXCISION OF TONGUE LESION    . HOT HEMOSTASIS N/A 08/02/2014   Procedure: HOT HEMOSTASIS (ARGON PLASMA COAGULATION/BICAP);  Surgeon: Lear Ng, MD;  Location: [facility];  Service: Endoscopy;  Laterality: N/A;  . SHOULDER SURGERY Left    benign tumor removed  . TONSILLECTOMY    . WISDOM TOOTH EXTRACTION      There were no vitals filed for this visit.  Subjective Assessment - 10/27/18 0948    Subjective  Patient reports 4/10 pain this am. Patient reports she continued to move shoulder in available ROM but reports she is having difficulty with lowering phase. Patient reports she is having increased tension/pain at R UT, R lateral upper arm, and "deep in armpit". Patient reports her motion is getting better overall which she is pleased  with     Limitations  Lifting;House hold activities;Writing    How long can you sit comfortably?  unlimited    How long can you stand comfortably?  unlimited    How long can you walk comfortably?  unlimited    Diagnostic tests  Xray R shoulder and elbow with no significant findings, CT head and neck with no significant findings    Patient Stated Goals  Lift UE to be able to don/doff clothing, and wash self    Pain Onset  1 to 4 weeks ago        Ther-Ex - Pulleys x20 abd/flex 3sec hold; full ROM by end of sets - AROM shoulder flex 3x 10 and abd 3x 10 with focus on eccentric control and scapular control/rhythm in pain free range (about 90d each position) - Standing IR and ER with RTB 3x 10 with towel under elbow for TC of proper positioning with focus on eccentric control and proper scapular positioning - Education on motor control and scapulo-humeral rhythm, and it's impact on proper mechanics and compensatory strategies when these mechanics are altered d/t asymmetric length tension relationships, and over activation of scapular elevators  Manual - STM withtrigger point releaseto Rpec minor, teres major/minor/latissimus,and UT. Later, middle deltoid Following:(6) 9mm .30 needles placed along theR UT (pincer grasp patient in  prone),middle deltoid with pincer grasp, and R latissimus and teres minor in supine with pincer grasp to reduce spasms and trigger points with the patient positioned in prone.                           PT Education - 10/27/18 1010    Education Details  Exercise form    Person(s) Educated  Patient    Methods  Explanation;Demonstration;Tactile cues;Verbal cues    Comprehension  Verbalized understanding;Returned demonstration;Tactile cues required;Verbal cues required       PT Short Term Goals - 09/28/18 1223      PT SHORT TERM GOAL #1   Title  Pt will be independent with HEP in order to improve strength and decrease pain in order to  improve pain-free function at home and work.    Time  4    Period  Weeks    Status  New        PT Long Term Goals - 09/28/18 1225      PT LONG TERM GOAL #1   Title  Pt will decrease worst pain as reported on NPRS by at least 3 points in order to demonstrate clinically significant reduction in pain.    Baseline  09/28/18 8/10 pain with movement    Time  8    Status  New      PT LONG TERM GOAL #2   Title  Patient will increase FOTO score to 60 to demonstrate predicted increase in functional mobility to complete ADLs    Baseline  09/28/18 41    Time  8    Period  Weeks    Status  New      PT LONG TERM GOAL #3   Title  Patient will demonstrate symmetrical shoulder motion wnl to complete ADLS    Baseline  31/54/00 see eval    Time  8    Period  Weeks    Status  New      PT LONG TERM GOAL #4   Title  Patient will demonstrate symmetrical shoulder strength to complete household chores    Baseline  09/28/18 see eval    Time  8    Period  Weeks    Status  New            Plan - 10/27/18 1122    Clinical Impression Statement  PT continued successful TDN techniques for muscle tension with decreased pain with AROM following. PT progressed therex with focus on motor control of motions without compensatory strategies. Patient is able to complete therex with accuracy following multiple cues for proper form. PT encouraged patient to continue HEP with this motor control in mind. Patient demonstrates and verbalizes understanding of all provided education    Rehab Potential  Good    Clinical Impairments Affecting Rehab Potential  (+) strong social support, motivation (-) age, sedentary lifestyle, other comorbidities    PT Frequency  2x / week    PT Duration  8 weeks    PT Treatment/Interventions  Electrical Stimulation;Traction;Ultrasound;Therapeutic activities;Neuromuscular re-education;Manual techniques;Energy conservation;Dry needling;Taping;Joint Manipulations;Spinal  Manipulations;Patient/family education;Functional mobility training;Therapeutic exercise;Passive range of motion;Iontophoresis 4mg /ml Dexamethasone;ADLs/Self Care Home Management;Aquatic Therapy;Cryotherapy;Moist Heat    PT Next Visit Plan  Shoulder ROM, TDN, periscapular strengthening    PT Home Exercise Plan  pulleys abd and flex, supine ER/IR stretch, pec stretch, UT stretch, tband scaption, radial nerve glide    Consulted and Agree with Plan of Care  Patient  Patient will benefit from skilled therapeutic intervention in order to improve the following deficits and impairments:  Decreased activity tolerance, Decreased endurance, Decreased range of motion, Decreased strength, Increased fascial restricitons, Impaired sensation, Impaired UE functional use, Improper body mechanics, Pain, Decreased mobility, Impaired flexibility, Impaired tone  Visit Diagnosis: Acute pain of right shoulder     Problem List Patient Active Problem List   Diagnosis Date Noted  . Colon polyps 08/02/2014   [name] PT, DPT [name] 10/27/2018, 11:41 AM  [place] [facility] [address], [place], [address] Phone: [phone]   Fax:  [phone]  Name: Cindy Perez MRN: [id number] Date of Birth: [birth year]

## 2018-10-29 ENCOUNTER — Ambulatory Visit: Payer: Medicare PPO | Admitting: Physical Therapy

## 2018-11-02 ENCOUNTER — Ambulatory Visit: Payer: Medicare PPO | Attending: Family Medicine | Admitting: Physical Therapy

## 2018-11-02 ENCOUNTER — Encounter: Payer: Self-pay | Admitting: Physical Therapy

## 2018-11-02 DIAGNOSIS — M25511 Pain in right shoulder: Secondary | ICD-10-CM | POA: Insufficient documentation

## 2018-11-02 NOTE — Therapy (Signed)
[place] [facility] [address], [place], [address] Phone: [phone]   Fax:  [phone]  Physical Therapy Treatment  Patient Details  Name: Cindy Perez MRN: [id number] Date of Birth: 1951/09/02 Referring Provider (PT): Addison Lank   Encounter Date: 11/02/2018  PT End of Session - 11/02/18 6644    Visit Number  8    Number of Visits  17    Date for PT Re-Evaluation  11/23/18    PT Start Time  0315    PT Stop Time  0400    PT Time Calculation (min)  45 min    Activity Tolerance  Patient tolerated treatment well    Behavior During Therapy  [facility] for tasks assessed/performed       Past Medical History:  Diagnosis Date  . Arthritis    neck, hands, toes  . Depression   . Herpes   . Hypothyroidism   . Sinus trouble    occasional  . Vitamin D deficiency    takes supplement    Past Surgical History:  Procedure Laterality Date  . COLONOSCOPY    . COLONOSCOPY WITH PROPOFOL N/A 08/02/2014   Procedure: COLONOSCOPY WITH PROPOFOL;  Surgeon: Lear Ng, MD;  Location: [facility];  Service: Endoscopy;  Laterality: N/A;  . DILATION AND CURETTAGE OF UTERUS    . EXCISION OF TONGUE LESION    . HOT HEMOSTASIS N/A 08/02/2014   Procedure: HOT HEMOSTASIS (ARGON PLASMA COAGULATION/BICAP);  Surgeon: Lear Ng, MD;  Location: [facility];  Service: Endoscopy;  Laterality: N/A;  . SHOULDER SURGERY Left    benign tumor removed  . TONSILLECTOMY    . WISDOM TOOTH EXTRACTION      There were no vitals filed for this visit.  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 Patient reports her pain has started to get better over today, reports 4/10 pain today, says elevated to 7/10 over the weekend.      Pertinent History  Patient is a 68 year old female presenting with R  shoulder pain. Patient reports she openned a door at church that she didn't realize had a step and fell 07/31/18. Patient reports she continued to hold on to door handle with R hand while she fell inside. Patient reports she has had consistent pain since the fall that has only gotten a little better". Reports she also "hit her elbow but that is much better". Is concerned with pain that inhibits her ability to use her R hand, with increased pain with writing, quilting, and ADLs (washing hair, reaching behind, applying deorderant, folding laundry). 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 Patient also reports frequent trips to [place] to visit family. Worst pain in past week: 8/10 best: 2/10 in the R shoulder, with 2/10 constant pain in R hand which she reports has a tingling/burning sensation in her R palm into her middle and ring finger. Patient reports PMH of fall with resultant post-concussion syndrome June 2016. Pt denies N/V, unexplained weight fluctuation, saddle paresthesia, fever, B&B changes, ight sweats, or unrelenting night pain at this time.    Limitations  Lifting;House hold activities;Writing    How long can you sit comfortably?  unlimited    How long can you stand comfortably?  unlimited    How long can you  walk comfortably?  unlimited    Diagnostic tests  Xray R shoulder and elbow with no significant findings, CT head and neck with no significant findings    Patient Stated Goals  Lift UE to be able to don/doff clothing, and wash self       Ther-Ex - Pulleys x20 abd/flex 3sec hold; full ROM by end of sets - Lat pulldown #20 2x 10; 25# x10 with min cuing for eccentric control with good carry over following - T and I position in supine x10 each (attempted Y with increased pain so discontinued); w/ 1# DB 2x 10 with max cuing initially for proper form with good scapular contraction   Manual - STM withtrigger point releaseto Rpec  minor, R teres major/minor/latissimus,R UT, R middle deltoid- increased time spent at middle deltoid as patient has increased trigger points and pain here. Patient reports this is the location of pain with active abd Following:(6)32mm .30 needles placed along theR UT (pincer grasp patient in prone),middle deltoid with pincer grasp, and R latissimus and teres minor in supine with pincer grasp to reducespasms and trigger points with the patient positioned in prone.                         PT Education - 11/02/18 1554    Education Details  Exercise form    Person(s) Educated  Patient    Methods  Explanation;Tactile cues;Verbal cues    Comprehension  Verbalized understanding;Verbal cues required;Tactile cues required       PT Short Term Goals - 09/28/18 1223      PT SHORT TERM GOAL #1   Title  Pt will be independent with HEP in order to improve strength and decrease pain in order to improve pain-free function at home and work.    Time  4    Period  Weeks    Status  New        PT Long Term Goals - 09/28/18 1225      PT LONG TERM GOAL #1   Title  Pt will decrease worst pain as reported on NPRS by at least 3 points in order to demonstrate clinically significant reduction in pain.    Baseline  09/28/18 8/10 pain with movement    Time  8    Status  New      PT LONG TERM GOAL #2   Title  Patient will increase FOTO score to 60 to demonstrate predicted increase in functional mobility to complete ADLs    Baseline  09/28/18 41    Time  8    Period  Weeks    Status  New      PT LONG TERM GOAL #3   Title  Patient will demonstrate symmetrical shoulder motion wnl to complete ADLS    Baseline  25/36/64 see eval    Time  8    Period  Weeks    Status  New      PT LONG TERM GOAL #4   Title  Patient will demonstrate symmetrical shoulder strength to complete household chores    Baseline  09/28/18 see eval    Time  8    Period  Weeks    Status  New             Plan - 11/03/18 1335    Clinical Impression Statement  PT continued TDN with increased STM needed following increased upper crossed tension d/t time spent handwriting sermon this weekend.  PT continued therex progression for postural strengthening and carry over of motor control following decreased anterior tension.     Rehab Potential  Good    Clinical Impairments Affecting Rehab Potential  (+) strong social support, motivation (-) age, sedentary lifestyle, other comorbidities    PT Frequency  2x / week    PT Duration  8 weeks    PT Treatment/Interventions  Electrical Stimulation;Traction;Ultrasound;Therapeutic activities;Neuromuscular re-education;Manual techniques;Energy conservation;Dry needling;Taping;Joint Manipulations;Spinal Manipulations;Patient/family education;Functional mobility training;Therapeutic exercise;Passive range of motion;Iontophoresis 4mg /ml Dexamethasone;ADLs/Self Care Home Management;Aquatic Therapy;Cryotherapy;Moist Heat    PT Next Visit Plan  Shoulder ROM, TDN, periscapular strengthening    PT Home Exercise Plan  pulleys abd and flex, supine ER/IR stretch, pec stretch, UT stretch, tband scaption, radial nerve glide    Consulted and Agree with Plan of Care  Patient       Patient will benefit from skilled therapeutic intervention in order to improve the following deficits and impairments:  Decreased activity tolerance, Decreased endurance, Decreased range of motion, Decreased strength, Increased fascial restricitons, Impaired sensation, Impaired UE functional use, Improper body mechanics, Pain, Decreased mobility, Impaired flexibility, Impaired tone  Visit Diagnosis: Acute pain of right shoulder     Problem List Patient Active Problem List   Diagnosis Date Noted  . Colon polyps 08/02/2014   [name] PT, DPT [name] 11/03/2018, 1:52 PM  [facility] [address] [place], [address] Phone: [phone]   Fax:  [phone]  Name: Cindy Perez MRN: [id number] Date of Birth: 1950/10/13

## 2018-11-04 ENCOUNTER — Ambulatory Visit: Payer: Medicare PPO | Admitting: Physical Therapy

## 2018-11-09 ENCOUNTER — Encounter: Payer: Self-pay | Admitting: Physical Therapy

## 2018-11-09 ENCOUNTER — Ambulatory Visit: Payer: Medicare PPO | Admitting: Physical Therapy

## 2018-11-09 DIAGNOSIS — M25511 Pain in right shoulder: Secondary | ICD-10-CM

## 2018-11-09 NOTE — Therapy (Signed)
[place] [facility] [address], [place], [address] Phone: [phone]   Fax:  [phone]  Physical Therapy Treatment  Patient Details  Name: Cindy Perez MRN: [id number] Date of Birth: [birth year] Referring Provider (PT): Addison Lank   Encounter Date: 11/09/2018  PT End of Session - 11/09/18 1542    Visit Number  9    Number of Visits  17    Date for PT Re-Evaluation  11/23/18    PT Start Time  0315    PT Stop Time  0400    PT Time Calculation (min)  45 min    Activity Tolerance  Patient tolerated treatment well    Behavior During Therapy  [facility] for tasks assessed/performed       Past Medical History:  Diagnosis Date  . Arthritis    neck, hands, toes  . Depression   . Herpes   . Hypothyroidism   . Sinus trouble    occasional  . Vitamin D deficiency    takes supplement    Past Surgical History:  Procedure Laterality Date  . COLONOSCOPY    . COLONOSCOPY WITH PROPOFOL N/A 08/02/2014   Procedure: COLONOSCOPY WITH PROPOFOL;  Surgeon: Lear Ng, MD;  Location: [facility];  Service: Endoscopy;  Laterality: N/A;  . DILATION AND CURETTAGE OF UTERUS    . EXCISION OF TONGUE LESION    . HOT HEMOSTASIS N/A 08/02/2014   Procedure: HOT HEMOSTASIS (ARGON PLASMA COAGULATION/BICAP);  Surgeon: Lear Ng, MD;  Location: [facility];  Service: Endoscopy;  Laterality: N/A;  . SHOULDER SURGERY Left    benign tumor removed  . TONSILLECTOMY    . WISDOM TOOTH EXTRACTION      There were no vitals filed for this visit.  Subjective Assessment - 11/09/18 1533    Subjective  Patient reports 3-4/10 pain today. Patietn reports motion was much better over the weekend which she is pleased with. Compliance with HEP     Pertinent History  Patient is a 68 year old female presenting with R shoulder pain. Patient reports she openned a door at church that she didn't realize had a step and fell 07/31/18. Patient  reports she continued to hold on to door handle with R hand while she fell inside. Patient reports she has had consistent pain since the fall that has only gotten a little better". Reports she also "hit her elbow but that is much better". Is concerned with pain that inhibits her ability to use her R hand, with increased pain with writing, quilting, and ADLs (washing hair, reaching behind, applying deorderant, folding laundry). 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 Patient also reports frequent trips to [place] to visit family. Worst pain in past week: 8/10 best: 2/10 in the R shoulder, with 2/10 constant pain in R hand which she reports has a tingling/burning sensation in her R palm into her middle and ring finger. Patient reports PMH of fall with resultant post-concussion syndrome June 2016. Pt denies N/V, unexplained weight fluctuation, saddle paresthesia, fever, B&B changes, ight sweats, or unrelenting night pain at this time.    Limitations  Lifting;House hold activities;Writing    How long can you sit comfortably?  unlimited    How long can you stand comfortably?  unlimited    How long can you walk comfortably?  unlimited    Diagnostic tests  Xray R shoulder and elbow with no significant findings, CT head and neck  with no significant findings    Patient Stated Goals  Lift UE to be able to don/doff clothing, and wash self    Pain Onset  1 to 4 weeks ago       Ther-Ex - Pulleys x20 abd/flex 3sec hold;full ROM by end of sets - High rows 10# 3x 10 with TC initially for proper form/set up 3x 10/8/8 - Attempted IR and ER with 5# with set of 6 each way with patient unable to maintain proper form with despite cuing and demo; with GTB 2x 10  - Lat pulldown25# 3x 10 with good carry over from previous sessions - Scaption raise 2# DB 3x 10 with cuing for eccentric control with good carry over following  Manual - STM withtrigger point  releaseto R UT, and R middle deltoid- increased time spent at middle deltoid as patient has increased trigger points and pain here .Concordant sign Following:(6)39mm .30 needles placed along theR UT (pincer grasp patient in prone), and middle deltoid with pincer graspin supine with pincer graspto reducespasms and trigger points with the patient positioned in prone.                        PT Education - 11/09/18 1540    Education Details  exercise form    Person(s) Educated  Patient    Methods  Explanation;Demonstration;Tactile cues;Verbal cues    Comprehension  Verbalized understanding;Returned demonstration;Verbal cues required;Tactile cues required       PT Short Term Goals - 09/28/18 1223      PT SHORT TERM GOAL #1   Title  Pt will be independent with HEP in order to improve strength and decrease pain in order to improve pain-free function at home and work.    Time  4    Period  Weeks    Status  New        PT Long Term Goals - 09/28/18 1225      PT LONG TERM GOAL #1   Title  Pt will decrease worst pain as reported on NPRS by at least 3 points in order to demonstrate clinically significant reduction in pain.    Baseline  09/28/18 8/10 pain with movement    Time  8    Status  New      PT LONG TERM GOAL #2   Title  Patient will increase FOTO score to 60 to demonstrate predicted increase in functional mobility to complete ADLs    Baseline  09/28/18 41    Time  8    Period  Weeks    Status  New      PT LONG TERM GOAL #3   Title  Patient will demonstrate symmetrical shoulder motion wnl to complete ADLS    Baseline  73/71/06 see eval    Time  8    Period  Weeks    Status  New      PT LONG TERM GOAL #4   Title  Patient will demonstrate symmetrical shoulder strength to complete household chores    Baseline  09/28/18 see eval    Time  8    Period  Weeks    Status  New            Plan - 11/09/18 1550    Clinical Impression Statement   PT continued to utilize TDN for remaining soft tissue restrictions to encourage proper shoulder positioning prior to exercise. Patient is able to tolerate therex progression well with no  increased pain, only muscle fatigue. PT an patient discussed decreasing PT frequency for maintainence, will be assessed wit goals next visit.     Rehab Potential  Good    Clinical Impairments Affecting Rehab Potential  (+) strong social support, motivation (-) age, sedentary lifestyle, other comorbidities    PT Frequency  2x / week    PT Duration  8 weeks    PT Treatment/Interventions  Electrical Stimulation;Traction;Ultrasound;Therapeutic activities;Neuromuscular re-education;Manual techniques;Energy conservation;Dry needling;Taping;Joint Manipulations;Spinal Manipulations;Patient/family education;Functional mobility training;Therapeutic exercise;Passive range of motion;Iontophoresis 4mg /ml Dexamethasone;ADLs/Self Care Home Management;Aquatic Therapy;Cryotherapy;Moist Heat    PT Next Visit Plan  progress note    PT Home Exercise Plan  pulleys abd and flex, supine ER/IR stretch, pec stretch, UT stretch, tband scaption, radial nerve glide    Consulted and Agree with Plan of Care  Patient       Patient will benefit from skilled therapeutic intervention in order to improve the following deficits and impairments:  Decreased activity tolerance, Decreased endurance, Decreased range of motion, Decreased strength, Increased fascial restricitons, Impaired sensation, Impaired UE functional use, Improper body mechanics, Pain, Decreased mobility, Impaired flexibility, Impaired tone  Visit Diagnosis: Acute pain of right shoulder     Problem List Patient Active Problem List   Diagnosis Date Noted  . Colon polyps 08/02/2014   [name] PT, DPT [name] 11/09/2018, 3:56 PM  [facility] [place] [address] [place], [address] Phone: [phone]    Fax:  [phone]  Name: Joscelyn Hardrick MRN: [id number] Date of Birth: 1951-08-07

## 2018-11-11 ENCOUNTER — Encounter: Payer: Self-pay | Admitting: Physical Therapy

## 2018-11-11 ENCOUNTER — Ambulatory Visit: Payer: Medicare PPO | Admitting: Physical Therapy

## 2018-11-11 DIAGNOSIS — M25511 Pain in right shoulder: Secondary | ICD-10-CM | POA: Diagnosis not present

## 2018-11-11 NOTE — Therapy (Addendum)
[place] [facility] [address] [place], [address] Phone: [phone]   Fax:  [phone]  Physical Therapy Treatment/Progress Note Reporting Period 09/18/18 - 11/11/18 Patient Details  Name: Cindy Perez MRN: [id number] Date of Birth: 1951-02-27 Referring Provider (PT): Addison Lank   Encounter Date: 11/11/2018  PT End of Session - 11/11/18 3500    Visit Number  10    Number of Visits  17    Date for PT Re-Evaluation  11/23/18    PT Start Time  0315    PT Stop Time  0400    PT Time Calculation (min)  45 min    Activity Tolerance  Patient tolerated treatment well    Behavior During Therapy  [facility] for tasks assessed/performed       Past Medical History:  Diagnosis Date  . Arthritis    neck, hands, toes  . Depression   . Herpes   . Hypothyroidism   . Sinus trouble    occasional  . Vitamin D deficiency    takes supplement    Past Surgical History:  Procedure Laterality Date  . COLONOSCOPY    . COLONOSCOPY WITH PROPOFOL N/A 08/02/2014   Procedure: COLONOSCOPY WITH PROPOFOL;  Surgeon: Lear Ng, MD;  Location: [facility];  Service: Endoscopy;  Laterality: N/A;  . DILATION AND CURETTAGE OF UTERUS    . EXCISION OF TONGUE LESION    . HOT HEMOSTASIS N/A 08/02/2014   Procedure: HOT HEMOSTASIS (ARGON PLASMA COAGULATION/BICAP);  Surgeon: Lear Ng, MD;  Location: [facility];  Service: Endoscopy;  Laterality: N/A;  . SHOULDER SURGERY Left    benign tumor removed  . TONSILLECTOMY    . WISDOM TOOTH EXTRACTION      There were no vitals filed for this visit.  Subjective Assessment - 11/11/18 1518    Subjective  Patient reports 1/10 pain today, but reports pain has been increased with the weather over the past couple days. Reports compliance with HEP with no questions or concerns.     Pertinent History  Patient is a 68 year old female presenting with R shoulder pain. Patient reports she  openned a door at church that she didn't realize had a step and fell 07/31/18. Patient reports she continued to hold on to door handle with R hand while she fell inside. Patient reports she has had consistent pain since the fall that has only gotten a little better". Reports she also "hit her elbow but that is much better". Is concerned with pain that inhibits her ability to use her R hand, with increased pain with writing, quilting, and ADLs (washing hair, reaching behind, applying deorderant, folding laundry). 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 Patient also reports frequent trips to [place] to visit family. Worst pain in past week: 8/10 best: 2/10 in the R shoulder, with 2/10 constant pain in R hand which she reports has a tingling/burning sensation in her R palm into her middle and ring finger. Patient reports PMH of fall with resultant post-concussion syndrome June 2016. Pt denies N/V, unexplained weight fluctuation, saddle paresthesia, fever, B&B changes, ight sweats, or unrelenting night pain at this time.    Limitations  Lifting;House hold activities;Writing    How long can you sit comfortably?  unlimited    How long can you stand comfortably?  unlimited    How long can you walk comfortably?  unlimited    Diagnostic tests  Xray R  shoulder and elbow with no significant findings, CT head and neck with no significant findings    Patient Stated Goals  Lift UE to be able to don/doff clothing, and wash self    Pain Onset  1 to 4 weeks ago       Ther-Ex - Pulleys x20 abd/flex 3sec hold;full ROM by end of sets - Standing flex with YTB 2x 10  - Standing abd with YTB 2x 10 - Overhead press 2# DB 3x 10 - Standing row (neutral) and high row red tband x10 for HEP review with good carry over from previous sessions  - Standing IR and ER RTB 2x 10 with good carry over from last session - Verbal/visual review of scaption with 2# DB  - Printout  given of today's therex review with parameters of strength training education, and review of stretching HEP - AROM and strength assessment   Manual - Bouts of PROM in all planes with full ROM    11/11/18 R/L flex wnl bilat; abd wnl with "catch and pain" at 90d on elevation and return ; IR L2/T10; ER C8 bilat    11/11/18 R/L (out of 5) flex 4/4+ with pain on Rt; abd 4/4 pain both sides ; IR 5/5; ER 4/4+                  PT Education - 11/11/18 1522    Education Details  POC update    Person(s) Educated  Patient    Methods  Explanation    Comprehension  Verbalized understanding       PT Short Term Goals - 09/28/18 1223      PT SHORT TERM GOAL #1   Title  Pt will be independent with HEP in order to improve strength and decrease pain in order to improve pain-free function at home and work.    Time  4    Period  Weeks    Status  New        PT Long Term Goals - 11/11/18 1523      PT LONG TERM GOAL #1   Title  Pt will decrease worst pain as reported on NPRS by at least 3 points in order to demonstrate clinically significant reduction in pain.    Baseline  11/11/18 5/10 with abd     Time  8    Period  Weeks    Status  Achieved      PT LONG TERM GOAL #2   Title  Patient will increase FOTO score to 60 to demonstrate predicted increase in functional mobility to complete ADLs    Baseline  11/11/18 58    Time  8    Period  Weeks    Status  On-going      PT LONG TERM GOAL #3   Title  Patient will demonstrate symmetrical shoulder active motion wnl to complete ADLS    Baseline  9/62/95 R/L flex wnl bilat; abd wnl with "catch and pain" at 90d on elevation and return ; IR L2/T10; ER C8 bilat    Status  On-going      PT LONG TERM GOAL #4   Title  Patient will demonstrate symmetrical shoulder strength to complete household chores    Baseline   11/11/18 R/L (out of 5) flex 4/4+ with pain on Rt; abd 4/4 pain both sides ; IR 5/5; ER 4/4+    Time  8    Period  Weeks     Status  On-going  Plan - 11/11/18 1622    Clinical Impression Statement  PT re-assessed goals this sessions, and reviewed HEP. Patient is demonstrating increased PROM, and near full- but painful AROM. PT discussed importance of continuing AROM and strengthening to restore symmetry to prevent injury, return to PLOF, and restore full AROM of shoulder for ADLs. Patient demonstrated and verbalized understanding of POC update and HEP.     Rehab Potential  Good    Clinical Impairments Affecting Rehab Potential  (+) strong social support, motivation (-) age, sedentary lifestyle, other comorbidities    PT Frequency  2x / week    PT Duration  8 weeks    PT Treatment/Interventions  Electrical Stimulation;Traction;Ultrasound;Therapeutic activities;Neuromuscular re-education;Manual techniques;Energy conservation;Dry needling;Taping;Joint Manipulations;Spinal Manipulations;Patient/family education;Functional mobility training;Therapeutic exercise;Passive range of motion;Iontophoresis 4mg /ml Dexamethasone;ADLs/Self Care Home Management;Aquatic Therapy;Cryotherapy;Moist Heat    PT Next Visit Plan  strengthening, AROM    PT Home Exercise Plan  pulleys abd and flex, supine ER/IR stretch, pec stretch, UT stretch, tband scaption, radial nerve glide    Consulted and Agree with Plan of Care  Patient       Patient will benefit from skilled therapeutic intervention in order to improve the following deficits and impairments:  Decreased activity tolerance, Decreased endurance, Decreased range of motion, Decreased strength, Increased fascial restricitons, Impaired sensation, Impaired UE functional use, Improper body mechanics, Pain, Decreased mobility, Impaired flexibility, Impaired tone  Visit Diagnosis: Acute pain of right shoulder     Problem List Patient Active Problem List   Diagnosis Date Noted  . Colon polyps 08/02/2014   [name] PT, DPT [name] 11/11/2018, 4:32 PM  [facility] [address], [place], [address] Phone: [phone]   Fax:  [phone]  Name: Cindy Perez MRN: [id number] Date of Birth: 1950/11/20

## 2018-11-16 ENCOUNTER — Ambulatory Visit: Payer: Medicare PPO | Admitting: Physical Therapy

## 2018-11-18 ENCOUNTER — Encounter: Payer: Self-pay | Admitting: Physical Therapy

## 2018-11-18 ENCOUNTER — Ambulatory Visit: Payer: Medicare PPO | Admitting: Physical Therapy

## 2018-11-18 DIAGNOSIS — M25511 Pain in right shoulder: Secondary | ICD-10-CM | POA: Diagnosis not present

## 2018-11-18 NOTE — Therapy (Signed)
[facility] [address] [place], [address] Phone: [phone]   Fax:  [phone]  Physical Therapy Treatment  Patient Details  Name: Cindy Perez MRN: [id number] Date of Birth: 1950-11-05 Referring Provider (PT): Addison Lank   Encounter Date: 11/18/2018  PT End of Session - 11/18/18 1656    Visit Number  11    Number of Visits  17    Date for PT Re-Evaluation  11/23/18    PT Start Time  0315    PT Stop Time  0400    PT Time Calculation (min)  45 min    Activity Tolerance  Patient tolerated treatment well    Behavior During Therapy  [facility] for tasks assessed/performed       Past Medical History:  Diagnosis Date  . Arthritis    neck, hands, toes  . Depression   . Herpes   . Hypothyroidism   . Sinus trouble    occasional  . Vitamin D deficiency    takes supplement    Past Surgical History:  Procedure Laterality Date  . COLONOSCOPY    . COLONOSCOPY WITH PROPOFOL N/A 08/02/2014   Procedure: COLONOSCOPY WITH PROPOFOL;  Surgeon: Lear Ng, MD;  Location: [facility];  Service: Endoscopy;  Laterality: N/A;  . DILATION AND CURETTAGE OF UTERUS    . EXCISION OF TONGUE LESION    . HOT HEMOSTASIS N/A 08/02/2014   Procedure: HOT HEMOSTASIS (ARGON PLASMA COAGULATION/BICAP);  Surgeon: Lear Ng, MD;  Location: [facility];  Service: Endoscopy;  Laterality: N/A;  . SHOULDER SURGERY Left    benign tumor removed  . TONSILLECTOMY    . WISDOM TOOTH EXTRACTION      There were no vitals filed for this visit.  Subjective Assessment - 11/18/18 1657    Subjective  Paitent reports increased pain this session following "intense pain at middle deltoid" that she reports subsided following ceasing exercise (overhead press), but is "a little sore today".     Pertinent History  Patient is a 68 year old female presenting with R shoulder pain. Patient reports she openned a door at church that she  didn't realize had a step and fell 07/31/18. Patient reports she continued to hold on to door handle with R hand while she fell inside. Patient reports she has had consistent pain since the fall that has only gotten a little better". Reports she also "hit her elbow but that is much better". Is concerned with pain that inhibits her ability to use her R hand, with increased pain with writing, quilting, and ADLs (washing hair, reaching behind, applying deorderant, folding laundry). 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 Patient also reports frequent trips to [place] to visit family. Worst pain in past week: 8/10 best: 2/10 in the R shoulder, with 2/10 constant pain in R hand which she reports has a tingling/burning sensation in her R palm into her middle and ring finger. Patient reports PMH of fall with resultant post-concussion syndrome June 2016. Pt denies N/V, unexplained weight fluctuation, saddle paresthesia, fever, B&B changes, ight sweats, or unrelenting night pain at this time.    Limitations  Lifting;House hold activities;Writing    How long can you sit comfortably?  unlimited    How long can you stand comfortably?  unlimited    How long can you walk comfortably?  unlimited    Diagnostic tests  Xray R shoulder and elbow with no significant  findings, CT head and neck with no significant findings    Patient Stated Goals  Lift UE to be able to don/doff clothing, and wash self    Pain Onset  1 to 4 weeks ago       Ther-Ex - Pulleys x20 abd/flex 3sec hold;full ROM by end of sets - Standing rows 15# 3x 10 with patient reporting some pain in bicep by the end of session  Manual - STM withtrigger point releaseto RUT, R pec minor, R latissimus/teres minor, andRmiddle deltoid- increased time spent at middle deltoid as patient has increased trigger points and pain here .Concordant sign Following:(6)69mm .30 needles placed along theR pec  minor, R latissimus/teres minor, and middle deltoid with pincer grasp in supine to reducespasms and trigger points with decreased shoulder height in supine and full shoulder ROM following Multiple bouts of PROM in all planes with 10sec holds in match ROM, increasing ROM as able GHJ AP mob grade II30sec bout 6 bouts to decrease pain; grade III with abd 30sec bouts to increase ROM   Biceps Load II (120 elevation, full ER, 90 elbow flexion, full supination, resisted elbow flexion): POSITIVE  Crank (160 scaption, axial load with IR/ER): POSITIVE Active Compression Test: POSITIVE Speed (shoulder flexion to 90, external rotation, full elbow extension, and forearm supination with resistance: POSITIVE Yergason's (resisted shoulder ER and supination/biceps tendon pathology): NEGATIVE  Most testing positive, but for pain at non specific pattern                       PT Education - 11/18/18 1657    Education Details  HEP update    Person(s) Educated  Patient    Methods  Explanation    Comprehension  Verbalized understanding       PT Short Term Goals - 09/28/18 1223      PT SHORT TERM GOAL #1   Title  Pt will be independent with HEP in order to improve strength and decrease pain in order to improve pain-free function at home and work.    Time  4    Period  Weeks    Status  New        PT Long Term Goals - 11/11/18 1523      PT LONG TERM GOAL #1   Title  Pt will decrease worst pain as reported on NPRS by at least 3 points in order to demonstrate clinically significant reduction in pain.    Baseline  11/11/18 5/10 with abd     Time  8    Period  Weeks    Status  Achieved      PT LONG TERM GOAL #2   Title  Patient will increase FOTO score to 60 to demonstrate predicted increase in functional mobility to complete ADLs    Baseline  11/11/18 58    Time  8    Period  Weeks    Status  On-going      PT LONG TERM GOAL #3   Title  Patient will demonstrate symmetrical  shoulder active motion wnl to complete ADLS    Baseline  10/29/84 R/L flex wnl bilat; abd wnl with "catch and pain" at 90d on elevation and return ; IR L2/T10; ER C8 bilat    Status  On-going      PT LONG TERM GOAL #4   Title  Patient will demonstrate symmetrical shoulder strength to complete household chores    Baseline   11/11/18 R/L (out of  5) flex 4/4+ with pain on Rt; abd 4/4 pain both sides ; IR 5/5; ER 4/4+    Time  8    Period  Weeks    Status  On-going            Plan - 11/18/18 1702    Clinical Impression Statement  PT assessed labrum/bicep tendinopathy this session, where patient does have some indications or this, but is difficult to localize d/t vast pain area reported from patient. Patient does localize most pain to middle deltoid, but when PT asks about C-shaped pattern, patient reports she occassionally has pain in this distribution as well. PT advised patient to focus on pain area and sicriptors prior to next visit and cease "painful" therex (overhead press or any other exercises that cause "sharp pain"). If pain persists PT would advise return to PCP with possible suggestion for imaging. Patient verbalized understanding of HEP update and POC.     Rehab Potential  Good    Clinical Impairments Affecting Rehab Potential  (+) strong social support, motivation (-) age, sedentary lifestyle, other comorbidities    PT Frequency  2x / week    PT Duration  8 weeks    PT Treatment/Interventions  Electrical Stimulation;Traction;Ultrasound;Therapeutic activities;Neuromuscular re-education;Manual techniques;Energy conservation;Dry needling;Taping;Joint Manipulations;Spinal Manipulations;Patient/family education;Functional mobility training;Therapeutic exercise;Passive range of motion;Iontophoresis 4mg /ml Dexamethasone;ADLs/Self Care Home Management;Aquatic Therapy;Cryotherapy;Moist Heat    PT Next Visit Plan  strengthening, AROM    PT Home Exercise Plan  pulleys abd and flex, supine ER/IR  stretch, pec stretch, UT stretch, tband scaption, radial nerve glide    Consulted and Agree with Plan of Care  Patient       Patient will benefit from skilled therapeutic intervention in order to improve the following deficits and impairments:  Decreased activity tolerance, Decreased endurance, Decreased range of motion, Decreased strength, Increased fascial restricitons, Impaired sensation, Impaired UE functional use, Improper body mechanics, Pain, Decreased mobility, Impaired flexibility, Impaired tone  Visit Diagnosis: Acute pain of right shoulder     Problem List Patient Active Problem List   Diagnosis Date Noted  . Colon polyps 08/02/2014   [name] PT, DPT [name] 11/18/2018, 5:13 PM  [facility] [address] [place], [address] Phone: [phone]   Fax:  [phone]  Name: Cindy Perez MRN: [id number] Date of Birth: Feb 02, 1951

## 2018-11-20 DIAGNOSIS — H9193 Unspecified hearing loss, bilateral: Secondary | ICD-10-CM | POA: Diagnosis not present

## 2018-11-20 DIAGNOSIS — Z7189 Other specified counseling: Secondary | ICD-10-CM | POA: Diagnosis not present

## 2018-11-20 DIAGNOSIS — E039 Hypothyroidism, unspecified: Secondary | ICD-10-CM | POA: Diagnosis not present

## 2018-11-20 DIAGNOSIS — M25511 Pain in right shoulder: Secondary | ICD-10-CM | POA: Diagnosis not present

## 2018-11-20 DIAGNOSIS — E559 Vitamin D deficiency, unspecified: Secondary | ICD-10-CM | POA: Diagnosis not present

## 2018-11-20 DIAGNOSIS — B009 Herpesviral infection, unspecified: Secondary | ICD-10-CM | POA: Diagnosis not present

## 2018-11-20 DIAGNOSIS — Z0000 Encounter for general adult medical examination without abnormal findings: Secondary | ICD-10-CM | POA: Diagnosis not present

## 2018-11-20 DIAGNOSIS — Z01419 Encounter for gynecological examination (general) (routine) without abnormal findings: Secondary | ICD-10-CM | POA: Diagnosis not present

## 2018-11-20 DIAGNOSIS — F3341 Major depressive disorder, recurrent, in partial remission: Secondary | ICD-10-CM | POA: Diagnosis not present

## 2018-11-20 DIAGNOSIS — E782 Mixed hyperlipidemia: Secondary | ICD-10-CM | POA: Diagnosis not present

## 2018-11-25 ENCOUNTER — Encounter: Payer: Self-pay | Admitting: Physical Therapy

## 2018-11-25 ENCOUNTER — Ambulatory Visit: Payer: Medicare PPO | Admitting: Physical Therapy

## 2018-11-25 DIAGNOSIS — M25511 Pain in right shoulder: Secondary | ICD-10-CM | POA: Diagnosis not present

## 2018-11-25 NOTE — Therapy (Signed)
[facility] [address] [place], [address] Phone: [phone]   Fax:  [phone]  Physical Therapy Treatment  Patient Details  Name: Cindy Perez MRN: [id number] Date of Birth: 08-Feb-1951 Referring Provider (PT): Addison Lank   Encounter Date: 11/25/2018  PT End of Session - 11/25/18 1003    Visit Number  12    Number of Visits  23    Date for PT Re-Evaluation  01/06/19    PT Start Time  0935    PT Stop Time  1015    PT Time Calculation (min)  40 min       Past Medical History:  Diagnosis Date  . Arthritis    neck, hands, toes  . Depression   . Herpes   . Hypothyroidism   . Sinus trouble    occasional  . Vitamin D deficiency    takes supplement    Past Surgical History:  Procedure Laterality Date  . COLONOSCOPY    . COLONOSCOPY WITH PROPOFOL N/A 08/02/2014   Procedure: COLONOSCOPY WITH PROPOFOL;  Surgeon: Lear Ng, MD;  Location: [facility];  Service: Endoscopy;  Laterality: N/A;  . DILATION AND CURETTAGE OF UTERUS    . EXCISION OF TONGUE LESION    . HOT HEMOSTASIS N/A 08/02/2014   Procedure: HOT HEMOSTASIS (ARGON PLASMA COAGULATION/BICAP);  Surgeon: Lear Ng, MD;  Location: [facility];  Service: Endoscopy;  Laterality: N/A;  . SHOULDER SURGERY Left    benign tumor removed  . TONSILLECTOMY    . WISDOM TOOTH EXTRACTION      There were no vitals filed for this visit.  Subjective Assessment - 11/25/18 0932    Subjective  Patient reports she has not had any more sharp pains with forgoing overhead therex. Reports muscle soreness pain 3/10, following exercising and Tai Chi. She returned to MD who completed XRAY    Pertinent History  Patient is a 68 year old female presenting with R shoulder pain. Patient reports she openned a door at church that she didn't realize had a step and fell 07/31/18. Patient reports she continued to hold on to door handle with R hand while she  fell inside. Patient reports she has had consistent pain since the fall that has only gotten a little better". Reports she also "hit her elbow but that is much better". Is concerned with pain that inhibits her ability to use her R hand, with increased pain with writing, quilting, and ADLs (washing hair, reaching behind, applying deorderant, folding laundry). 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 Patient also reports frequent trips to [place] to visit family. Worst pain in past week: 8/10 best: 2/10 in the R shoulder, with 2/10 constant pain in R hand which she reports has a tingling/burning sensation in her R palm into her middle and ring finger. Patient reports PMH of fall with resultant post-concussion syndrome June 2016. Pt denies N/V, unexplained weight fluctuation, saddle paresthesia, fever, B&B changes, ight sweats, or unrelenting night pain at this time.    Limitations  Lifting;House hold activities;Writing    How long can you sit comfortably?  unlimited    How long can you stand comfortably?  unlimited    How long can you walk comfortably?  unlimited    Diagnostic tests  Xray R shoulder and elbow with no significant findings, CT head and neck with no significant findings    Patient Stated Goals  Lift UE  to be able to don/doff clothing, and wash self    Pain Onset  1 to 4 weeks ago      Ther-Ex - Pulleys x20 abd/flex 3sec hold;full ROM by end of sets - Attempted supine chest stretch on rolled towel without "stretch sensation" - completed on half foam roll 42mn - Tricep ext 15# 3x 10 with demo and TC initially for proper form with good eccentric control with good carry following - Seated rows 20# 3x 10 with good carry over from previous session - Low rows with maintained tricep ext GTB x10; BTB x10 with min cuing initially for proper form with shoulder positioning with good carry over following  Manual - STM withtrigger point  releaseto RUT,R pec minor, R latissimus/teres minor, andRmiddle deltoid- increased time spent at middle deltoid as patient has increased trigger points and pain here .Concordant sign Following:(6)329m.30 needles placed along theR pec minor, R bicep,andmiddle deltoid with pincer grasp in supine to reducespasms and trigger points with decreased shoulder height in supine and full shoulder ROM following Multiple bouts of PROM in IR and ABD with 10sec holds in match ROM, increasing ROM as able GHJ AP mob grade II30sec bout 6 bouts to decrease pain; grade III with abd 30sec bouts to increase ROM                          PT Education - 11/25/18 1003    Education Details  Exercise form    Person(s) Educated  Patient    Methods  Explanation;Demonstration;Verbal cues;Tactile cues    Comprehension  Verbalized understanding;Returned demonstration;Verbal cues required;Tactile cues required       PT Short Term Goals - 09/28/18 1223      PT SHORT TERM GOAL #1   Title  Pt will be independent with HEP in order to improve strength and decrease pain in order to improve pain-free function at home and work.    Time  4    Period  Weeks    Status  New        PT Long Term Goals - 11/11/18 1523      PT LONG TERM GOAL #1   Title  Pt will decrease worst pain as reported on NPRS by at least 3 points in order to demonstrate clinically significant reduction in pain.    Baseline  11/11/18 5/10 with abd     Time  8    Period  Weeks    Status  Achieved      PT LONG TERM GOAL #2   Title  Patient will increase FOTO score to 60 to demonstrate predicted increase in functional mobility to complete ADLs    Baseline  11/11/18 58    Time  8    Period  Weeks    Status  On-going      PT LONG TERM GOAL #3   Title  Patient will demonstrate symmetrical shoulder active motion wnl to complete ADLS    Baseline  11/05/83/27/L flex wnl bilat; abd wnl with "catch and pain" at 90d on  elevation and return ; IR L2/T10; ER C8 bilat    Status  On-going      PT LONG TERM GOAL #4   Title  Patient will demonstrate symmetrical shoulder strength to complete household chores    Baseline   11/11/18 R/L (out of 5) flex 4/4+ with pain on Rt; abd 4/4 pain both sides ; IR 5/5; ER 4/4+  Time  8    Period  Weeks    Status  On-going            Plan - 11/25/18 1017    Clinical Impression Statement  PT continued manual techniques for pain management, which patient reports 0.5/10 following with improved IR ROM (to apleys T12). PT continued therex progression for posterior musculature to encourage netural posture to prevent ant tension from upper crossed syndrome. Patient is able to complete therex with accuracy following PT cuing PT will follow up with MD for possible need of further imaging as patient progress is beginning to plateau.     Rehab Potential  Good    Clinical Impairments Affecting Rehab Potential  (+) strong social support, motivation (-) age, sedentary lifestyle, other comorbidities    PT Frequency  2x / week    PT Duration  8 weeks    PT Treatment/Interventions  Electrical Stimulation;Traction;Ultrasound;Therapeutic activities;Neuromuscular re-education;Manual techniques;Energy conservation;Dry needling;Taping;Joint Manipulations;Spinal Manipulations;Patient/family education;Functional mobility training;Therapeutic exercise;Passive range of motion;Iontophoresis 29m/ml Dexamethasone;ADLs/Self Care Home Management;Aquatic Therapy;Cryotherapy;Moist Heat    PT Next Visit Plan  strengthening, AROM    PT Home Exercise Plan  pulleys abd and flex, supine ER/IR stretch, pec stretch, UT stretch, tband scaption, radial nerve glide    Consulted and Agree with Plan of Care  Patient       Patient will benefit from skilled therapeutic intervention in order to improve the following deficits and impairments:  Decreased activity tolerance, Decreased endurance, Decreased range of motion,  Decreased strength, Increased fascial restricitons, Impaired sensation, Impaired UE functional use, Improper body mechanics, Pain, Decreased mobility, Impaired flexibility, Impaired tone  Visit Diagnosis: Acute pain of right shoulder     Problem List Patient Active Problem List   Diagnosis Date Noted  . Colon polyps 08/02/2014   CShelton SilvasPT, DPT CShelton Silvas2/26/2020, 10:21 AM  [facility] [address] [place] [address]Phone: [phone]  Fax:  [phone] Name: CRiko LumsdenMRN: [id number] of Birth: 103-26-1952

## 2018-11-26 DIAGNOSIS — D2271 Melanocytic nevi of right lower limb, including hip: Secondary | ICD-10-CM | POA: Diagnosis not present

## 2018-11-26 DIAGNOSIS — L821 Other seborrheic keratosis: Secondary | ICD-10-CM | POA: Diagnosis not present

## 2018-11-26 DIAGNOSIS — D2262 Melanocytic nevi of left upper limb, including shoulder: Secondary | ICD-10-CM | POA: Diagnosis not present

## 2018-11-26 DIAGNOSIS — Z08 Encounter for follow-up examination after completed treatment for malignant neoplasm: Secondary | ICD-10-CM | POA: Diagnosis not present

## 2018-11-26 DIAGNOSIS — D225 Melanocytic nevi of trunk: Secondary | ICD-10-CM | POA: Diagnosis not present

## 2018-11-26 DIAGNOSIS — D2272 Melanocytic nevi of left lower limb, including hip: Secondary | ICD-10-CM | POA: Diagnosis not present

## 2018-11-26 DIAGNOSIS — Z85828 Personal history of other malignant neoplasm of skin: Secondary | ICD-10-CM | POA: Diagnosis not present

## 2018-11-26 DIAGNOSIS — D2261 Melanocytic nevi of right upper limb, including shoulder: Secondary | ICD-10-CM | POA: Diagnosis not present

## 2018-11-30 ENCOUNTER — Ambulatory Visit: Payer: Medicare PPO | Admitting: Physical Therapy

## 2018-12-02 ENCOUNTER — Ambulatory Visit: Payer: Medicare PPO | Attending: Family Medicine

## 2018-12-02 DIAGNOSIS — M25511 Pain in right shoulder: Secondary | ICD-10-CM | POA: Diagnosis not present

## 2018-12-02 NOTE — Therapy (Signed)
[facility] [address] [place], [address] Phone: [phone]   Fax:  [phone]  Physical Therapy Treatment  Patient Details  Name: Cindy Perez MRN: [id number] Date of Birth: 07-10-1951 Referring Provider (PT): Addison Lank   Encounter Date: 12/02/2018  PT End of Session - 12/02/18 1528    Visit Number  13    Number of Visits  23    Date for PT Re-Evaluation  01/06/19    PT Start Time  1520    PT Stop Time  1600    PT Time Calculation (min)  40 min    Activity Tolerance  Patient tolerated treatment well    Behavior During Therapy  [facility] for tasks assessed/performed       Past Medical History:  Diagnosis Date  . Arthritis    neck, hands, toes  . Depression   . Herpes   . Hypothyroidism   . Sinus trouble    occasional  . Vitamin D deficiency    takes supplement    Past Surgical History:  Procedure Laterality Date  . COLONOSCOPY    . COLONOSCOPY WITH PROPOFOL N/A 08/02/2014   Procedure: COLONOSCOPY WITH PROPOFOL;  Surgeon: Lear Ng, MD;  Location: [facility];  Service: Endoscopy;  Laterality: N/A;  . DILATION AND CURETTAGE OF UTERUS    . EXCISION OF TONGUE LESION    . HOT HEMOSTASIS N/A 08/02/2014   Procedure: HOT HEMOSTASIS (ARGON PLASMA COAGULATION/BICAP);  Surgeon: Lear Ng, MD;  Location: [facility];  Service: Endoscopy;  Laterality: N/A;  . SHOULDER SURGERY Left    benign tumor removed  . TONSILLECTOMY    . WISDOM TOOTH EXTRACTION      There were no vitals filed for this visit.  Subjective Assessment - 12/02/18 1521    Subjective  Pt reports no major updates this date. She says she continues with HEP, but has a lot more difficulty with overhead row. PT reports he rN/T in hands is now bilateral for the first time.     Pertinent History  Patient is a 68 year old female presenting with R shoulder pain. Patient reports she openned a door at church that she didn't  realize had a step and fell 07/31/18. Patient reports she continued to hold on to door handle with R hand while she fell inside. Patient reports she has had consistent pain since the fall that has only gotten a little better". Reports she also "hit her elbow but that is much better". Is concerned with pain that inhibits her ability to use her R hand, with increased pain with writing, quilting, and ADLs (washing hair, reaching behind, applying deorderant, folding laundry). 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 Patient also reports frequent trips to [place] to visit family. Worst pain in past week: 8/10 best: 2/10 in the R shoulder, with 2/10 constant pain in R hand which she reports has a tingling/burning sensation in her R palm into her middle and ring finger. Patient reports PMH of fall with resultant post-concussion syndrome June 2016. Pt denies N/V, unexplained weight fluctuation, saddle paresthesia, fever, B&B changes, ight sweats, or unrelenting night pain at this time.    Currently in Pain?  Yes    Pain Score  3     Pain Location  --   shoulder      INTERVENTION THIS DATE Therapeutic Exercise -Supine RUE skull cusher 2x10 c 2lb free weight -Supine RUE  long arc shoulder flexion (straight elbow) 2x10 c 2lb free weight  -Standing Cable Row 20#, 2x10  -Standing BUE shoulder extension (from70 degrees flexion) BTB 2x10  -RUE External rotation c redTB 2x10   Manual Therapy  -Bilat pec minor stretch  in supine 2x30sec  -Right scapular depression stretch 3x60sec  (pt reports decreased lateral neck tension) -Right pec minor release 1x60sec anterior, 1x60sec lateral -RIght scalenes release from T4 to 1st rib (active release technique)  -Right first rib mobility assessment: normal, unrestricted, symptom free     PT Short Term Goals - 09/28/18 1223      PT SHORT TERM GOAL #1   Title  Pt will be independent with HEP in order to improve  strength and decrease pain in order to improve pain-free function at home and work.    Time  4    Period  Weeks    Status  New        PT Long Term Goals - 11/11/18 1523      PT LONG TERM GOAL #1   Title  Pt will decrease worst pain as reported on NPRS by at least 3 points in order to demonstrate clinically significant reduction in pain.    Baseline  11/11/18 5/10 with abd     Time  8    Period  Weeks    Status  Achieved      PT LONG TERM GOAL #2   Title  Patient will increase FOTO score to 60 to demonstrate predicted increase in functional mobility to complete ADLs    Baseline  11/11/18 58    Time  8    Period  Weeks    Status  On-going      PT LONG TERM GOAL #3   Title  Patient will demonstrate symmetrical shoulder active motion wnl to complete ADLS    Baseline  0/93/23 R/L flex wnl bilat; abd wnl with "catch and pain" at 90d on elevation and return ; IR L2/T10; ER C8 bilat    Status  On-going      PT LONG TERM GOAL #4   Title  Patient will demonstrate symmetrical shoulder strength to complete household chores    Baseline   11/11/18 R/L (out of 5) flex 4/4+ with pain on Rt; abd 4/4 pain both sides ; IR 5/5; ER 4/4+    Time  8    Period  Weeks    Status  On-going            Plan - 12/02/18 1553    Clinical Impression Statement  Continued with mobility and strengthening program. Pt reports consistent pain in lateral shoulder which persists throughout without significant aggravation during activity. Manual to address pec minor limitations of scapular posture, then direct strethc of pec minor, during which pt has decreased tension in the Right lateral neck, worse with sidebending away, and reduced further with sidebend toward. Pt contiues to improve in tolerance to program, but maintains weakness in select areas about the shoulder and scapula.     Rehab Potential  Good    Clinical Impairments Affecting Rehab Potential  (+) strong social support, motivation (-) age, sedentary  lifestyle, other comorbidities    PT Frequency  2x / week    PT Duration  8 weeks    PT Treatment/Interventions  Electrical Stimulation;Traction;Ultrasound;Therapeutic activities;Neuromuscular re-education;Manual techniques;Energy conservation;Dry needling;Taping;Joint Manipulations;Spinal Manipulations;Patient/family education;Functional mobility training;Therapeutic exercise;Passive range of motion;Iontophoresis 4mg /ml Dexamethasone;ADLs/Self Care Home Management;Aquatic Therapy;Cryotherapy;Moist Heat    PT Next Visit Plan  strengthening, AROM    PT Home Exercise Plan  pulleys abd and flex, supine ER/IR stretch, pec stretch, UT stretch, t-band scaption, radial nerve glide    Consulted and Agree with Plan of Care  Patient       Patient will benefit from skilled therapeutic intervention in order to improve the following deficits and impairments:  Decreased activity tolerance, Decreased endurance, Decreased range of motion, Decreased strength, Increased fascial restricitons, Impaired sensation, Impaired UE functional use, Improper body mechanics, Pain, Decreased mobility, Impaired flexibility, Impaired tone  Visit Diagnosis: Acute pain of right shoulder     Problem List Patient Active Problem List   Diagnosis Date Noted  . Colon polyps 08/02/2014   4:03 PM, 12/02/18 Etta Grandchild, PT, DPT Physical Therapist - [place] [phone] (Office)    Etta Grandchild 12/02/2018, 4:02 PM  [facility] [address], [place], [address] Phone: [phone]   Fax:  [phone]  Name: Tyrone Balash MRN: [id number] Date of Birth: [birth year]

## 2018-12-03 ENCOUNTER — Ambulatory Visit: Payer: Medicare PPO | Admitting: Physical Therapy

## 2018-12-07 ENCOUNTER — Ambulatory Visit: Payer: Medicare PPO | Admitting: Physical Therapy

## 2018-12-07 ENCOUNTER — Encounter: Payer: Self-pay | Admitting: Physical Therapy

## 2018-12-07 DIAGNOSIS — M25511 Pain in right shoulder: Secondary | ICD-10-CM

## 2018-12-07 NOTE — Therapy (Signed)
[facility] [address], [place], [address] Phone: [phone]   Fax:  [phone]  Physical Therapy Treatment  Patient Details  Name: Cindy Perez MRN: [id number] Date of Birth: 06/12/1951 Referring Provider (PT): Addison Lank   Encounter Date: 12/07/2018  PT End of Session - 12/07/18 1640    Visit Number  14    Number of Visits  23    Date for PT Re-Evaluation  01/06/19    PT Start Time  0315    PT Stop Time  0400    PT Time Calculation (min)  45 min    Activity Tolerance  Patient tolerated treatment well    Behavior During Therapy  [facility] for tasks assessed/performed       Past Medical History:  Diagnosis Date  . Arthritis    neck, hands, toes  . Depression   . Herpes   . Hypothyroidism   . Sinus trouble    occasional  . Vitamin D deficiency    takes supplement    Past Surgical History:  Procedure Laterality Date  . COLONOSCOPY    . COLONOSCOPY WITH PROPOFOL N/A 08/02/2014   Procedure: COLONOSCOPY WITH PROPOFOL;  Surgeon: Lear Ng, MD;  Location: [facility];  Service: Endoscopy;  Laterality: N/A;  . DILATION AND CURETTAGE OF UTERUS    . EXCISION OF TONGUE LESION    . HOT HEMOSTASIS N/A 08/02/2014   Procedure: HOT HEMOSTASIS (ARGON PLASMA COAGULATION/BICAP);  Surgeon: Lear Ng, MD;  Location: [facility];  Service: Endoscopy;  Laterality: N/A;  . SHOULDER SURGERY Left    benign tumor removed  . TONSILLECTOMY    . WISDOM TOOTH EXTRACTION      There were no vitals filed for this visit.  Subjective Assessment - 12/07/18 1518    Subjective  Pt reports she had trouble picking up her sewing box with horizontal adduction and bringing it across her body to set somewhere else with pain and inability to continue holding box.     Pertinent History  Patient is a 68 year old female presenting with R shoulder pain. Patient reports she openned a door at church that she didn't  realize had a step and fell 07/31/18. Patient reports she continued to hold on to door handle with R hand while she fell inside. Patient reports she has had consistent pain since the fall that has only gotten a little better". Reports she also "hit her elbow but that is much better". Is concerned with pain that inhibits her ability to use her R hand, with increased pain with writing, quilting, and ADLs (washing hair, reaching behind, applying deorderant, folding laundry). 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 Patient also reports frequent trips to [place] to visit family. Worst pain in past week: 8/10 best: 2/10 in the R shoulder, with 2/10 constant pain in R hand which she reports has a tingling/burning sensation in her R palm into her middle and ring finger. Patient reports PMH of fall with resultant post-concussion syndrome June 2016. Pt denies N/V, unexplained weight fluctuation, saddle paresthesia, fever, B&B changes, ight sweats, or unrelenting night pain at this time.    Limitations  Lifting;House hold activities;Writing    How long can you sit comfortably?  unlimited    How long can you stand comfortably?  unlimited    How long can you walk comfortably?  unlimited    Diagnostic tests  Xray R shoulder and  elbow with no significant findings, CT head and neck with no significant findings    Patient Stated Goals  Lift UE to be able to don/doff clothing, and wash self    Pain Onset  1 to 4 weeks ago      Ther-Ex - Pulleys x20 abd/flex 3sec hold;full ROM by end of sets - Prone snow angels 3x 8 with cuing for maintained scapular contraction with good carry over following - Tricep ext 20# 3x 10 with good carry over from previous sessions with min cuing for posture with good carry over following - Standing tricep ext 5# DB x10 for example on how to complete at home  Manual - STM withtrigger point releaseto Rbicep, R tspine  paraspinals,R pec minor, R latissimus/teres minor,andRmiddle deltoid- increased time spent at middle deltoid and pec musculature as patient has increased trigger points and pain here Following:(6)70mm .30 needles placed along theRpec minorandmiddle deltoid with pincer grasp in supine to reducespasms and trigger points with decreased shoulder height in supine and full shoulder ROM following Multiple bouts of PROM in IR and ABD with 10sec holds in match ROM, increasing ROM as able GHJ AP mob grade II30sec bout 6 bouts to decrease pain; grade III with abd 30sec bouts to increase ROM Cervical traction x10 10sec on 10sec relax  (-) drop arm (+) empty can and full can                         PT Education - 12/07/18 1640    Education Details  Exercise form    Person(s) Educated  Patient    Methods  Explanation;Verbal cues    Comprehension  Verbalized understanding;Verbal cues required       PT Short Term Goals - 09/28/18 1223      PT SHORT TERM GOAL #1   Title  Pt will be independent with HEP in order to improve strength and decrease pain in order to improve pain-free function at home and work.    Time  4    Period  Weeks    Status  New        PT Long Term Goals - 11/11/18 1523      PT LONG TERM GOAL #1   Title  Pt will decrease worst pain as reported on NPRS by at least 3 points in order to demonstrate clinically significant reduction in pain.    Baseline  11/11/18 5/10 with abd     Time  8    Period  Weeks    Status  Achieved      PT LONG TERM GOAL #2   Title  Patient will increase FOTO score to 60 to demonstrate predicted increase in functional mobility to complete ADLs    Baseline  11/11/18 58    Time  8    Period  Weeks    Status  On-going      PT LONG TERM GOAL #3   Title  Patient will demonstrate symmetrical shoulder active motion wnl to complete ADLS    Baseline  02/06/31 R/L flex wnl bilat; abd wnl with "catch and pain" at 90d on  elevation and return ; IR L2/T10; ER C8 bilat    Status  On-going      PT LONG TERM GOAL #4   Title  Patient will demonstrate symmetrical shoulder strength to complete household chores    Baseline   11/11/18 R/L (out of 5) flex 4/4+ with pain on Rt; abd  4/4 pain both sides ; IR 5/5; ER 4/4+    Time  8    Period  Weeks    Status  On-going            Plan - 12/07/18 1641    Clinical Impression Statement  Patient with increased muscle tension and pain this session following increased pain with lifting over the wekeend. Following manual techniques patient does demonstrate better shoulder positioning/posture. PT continued therex for posterior muscle activation. PT will continue to attempt to reach out to MD on acquiring imaging of shoulder to rule out RTC/labrum tear.     Rehab Potential  Good    Clinical Impairments Affecting Rehab Potential  (+) strong social support, motivation (-) age, sedentary lifestyle, other comorbidities    PT Frequency  2x / week    PT Duration  8 weeks    PT Treatment/Interventions  Electrical Stimulation;Traction;Ultrasound;Therapeutic activities;Neuromuscular re-education;Manual techniques;Energy conservation;Dry needling;Taping;Joint Manipulations;Spinal Manipulations;Patient/family education;Functional mobility training;Therapeutic exercise;Passive range of motion;Iontophoresis 4mg /ml Dexamethasone;ADLs/Self Care Home Management;Aquatic Therapy;Cryotherapy;Moist Heat    PT Next Visit Plan  strengthening, AROM    PT Home Exercise Plan  pulleys abd and flex, supine ER/IR stretch, pec stretch, UT stretch, t-band scaption, radial nerve glide    Consulted and Agree with Plan of Care  Patient       Patient will benefit from skilled therapeutic intervention in order to improve the following deficits and impairments:  Decreased activity tolerance, Decreased endurance, Decreased range of motion, Decreased strength, Increased fascial restricitons, Impaired sensation,  Impaired UE functional use, Improper body mechanics, Pain, Decreased mobility, Impaired flexibility, Impaired tone  Visit Diagnosis: Acute pain of right shoulder     Problem List Patient Active Problem List   Diagnosis Date Noted  . Colon polyps 08/02/2014   [name] PT, DPT [name] 12/07/2018, 4:47 PM  [facility] [address], [place], [address] Phone: [phone]   Fax:  [phone]  Name: Willamae Demby MRN: [id number] Date of Birth: March 19, 1951

## 2018-12-08 ENCOUNTER — Ambulatory Visit: Payer: Medicare PPO | Admitting: Physical Therapy

## 2018-12-08 DIAGNOSIS — Z961 Presence of intraocular lens: Secondary | ICD-10-CM | POA: Diagnosis not present

## 2018-12-22 ENCOUNTER — Ambulatory Visit: Payer: Medicare PPO | Admitting: Physical Therapy

## 2018-12-29 ENCOUNTER — Encounter: Payer: Medicare PPO | Admitting: Physical Therapy

## 2019-01-03 ENCOUNTER — Encounter: Payer: Self-pay | Admitting: Physical Therapy

## 2019-01-03 NOTE — Therapy (Unsigned)
[facility] [address] [place], [address] Phone: [phone]   Fax:  [phone]  Patient Details  Name: Cindy Perez MRN: [id number] Date of Birth: 1951-05-25 Referring Provider:  No ref. provider found  Encounter Date: 01/03/2019 Left a message with patient to follow up on any PT needs, HEP progress, and interest in telehealth therapy.   [name] PT, DPT  [name] 01/03/2019, 3:38 PM  [facility] [address] [place], [address] Phone: [phone]   Fax:  [phone]

## 2019-01-05 ENCOUNTER — Encounter: Payer: Medicare PPO | Admitting: Physical Therapy

## 2019-01-11 ENCOUNTER — Encounter: Payer: Medicare PPO | Admitting: Physical Therapy

## 2019-01-18 ENCOUNTER — Encounter: Payer: Medicare PPO | Admitting: Physical Therapy

## 2019-01-18 NOTE — Therapy (Signed)
[facility] [address], [place], [address] Phone: [phone]   Fax:  [phone]  Patient Details  Name: Cindy Perez MRN: [id number] Date of Birth: September 20, 1951 Referring Provider:  No ref. provider found  Encounter Date: 01/18/2019  The [facility] outpatient clinics are closed at this time due to the COVID-19 epidemic. The patient was contacted in regards to their therapy services. The patient is in agreement that they are safe and consent to being on hold for therapy services until the [facility] outpatient facilities reopen. At which time, the patient will be contacted to schedule an appointment to resume therapy services.     Blythe Stanford, PT DPT 01/18/2019, 2:25 PM  [facility] [address], [place], [address] Phone: [phone]   Fax:  [phone]

## 2019-05-28 DIAGNOSIS — F3340 Major depressive disorder, recurrent, in remission, unspecified: Secondary | ICD-10-CM | POA: Diagnosis not present

## 2019-05-28 DIAGNOSIS — F0781 Postconcussional syndrome: Secondary | ICD-10-CM | POA: Diagnosis not present

## 2019-05-28 DIAGNOSIS — E782 Mixed hyperlipidemia: Secondary | ICD-10-CM | POA: Diagnosis not present

## 2019-05-28 DIAGNOSIS — E559 Vitamin D deficiency, unspecified: Secondary | ICD-10-CM | POA: Diagnosis not present

## 2019-05-28 DIAGNOSIS — M810 Age-related osteoporosis without current pathological fracture: Secondary | ICD-10-CM | POA: Diagnosis not present

## 2019-05-28 DIAGNOSIS — B009 Herpesviral infection, unspecified: Secondary | ICD-10-CM | POA: Diagnosis not present

## 2019-05-28 DIAGNOSIS — E039 Hypothyroidism, unspecified: Secondary | ICD-10-CM | POA: Diagnosis not present

## 2019-05-28 DIAGNOSIS — M75101 Unspecified rotator cuff tear or rupture of right shoulder, not specified as traumatic: Secondary | ICD-10-CM | POA: Diagnosis not present

## 2019-06-11 DIAGNOSIS — B009 Herpesviral infection, unspecified: Secondary | ICD-10-CM | POA: Diagnosis not present

## 2019-06-11 DIAGNOSIS — F0781 Postconcussional syndrome: Secondary | ICD-10-CM | POA: Diagnosis not present

## 2019-06-11 DIAGNOSIS — M75101 Unspecified rotator cuff tear or rupture of right shoulder, not specified as traumatic: Secondary | ICD-10-CM | POA: Diagnosis not present

## 2019-06-11 DIAGNOSIS — E559 Vitamin D deficiency, unspecified: Secondary | ICD-10-CM | POA: Diagnosis not present

## 2019-06-11 DIAGNOSIS — Z23 Encounter for immunization: Secondary | ICD-10-CM | POA: Diagnosis not present

## 2019-06-11 DIAGNOSIS — E782 Mixed hyperlipidemia: Secondary | ICD-10-CM | POA: Diagnosis not present

## 2019-06-11 DIAGNOSIS — F3340 Major depressive disorder, recurrent, in remission, unspecified: Secondary | ICD-10-CM | POA: Diagnosis not present

## 2019-06-11 DIAGNOSIS — E039 Hypothyroidism, unspecified: Secondary | ICD-10-CM | POA: Diagnosis not present

## 2019-06-11 DIAGNOSIS — M810 Age-related osteoporosis without current pathological fracture: Secondary | ICD-10-CM | POA: Diagnosis not present

## 2019-10-15 DIAGNOSIS — F3340 Major depressive disorder, recurrent, in remission, unspecified: Secondary | ICD-10-CM | POA: Diagnosis not present

## 2019-10-15 DIAGNOSIS — E039 Hypothyroidism, unspecified: Secondary | ICD-10-CM | POA: Diagnosis not present

## 2019-10-15 DIAGNOSIS — E782 Mixed hyperlipidemia: Secondary | ICD-10-CM | POA: Diagnosis not present

## 2019-10-15 DIAGNOSIS — M810 Age-related osteoporosis without current pathological fracture: Secondary | ICD-10-CM | POA: Diagnosis not present

## 2019-10-15 DIAGNOSIS — F3341 Major depressive disorder, recurrent, in partial remission: Secondary | ICD-10-CM | POA: Diagnosis not present

## 2019-10-20 ENCOUNTER — Ambulatory Visit: Payer: Medicare PPO | Attending: Internal Medicine

## 2019-10-20 DIAGNOSIS — Z23 Encounter for immunization: Secondary | ICD-10-CM | POA: Insufficient documentation

## 2019-10-20 NOTE — Progress Notes (Signed)
   Covid-19 Vaccination Clinic  Name:  Cindy Perez    MRN: [id number] DOB: 13-Sep-1951  10/20/2019  [name] was observed post Covid-19 immunization for 30 minutes based on pre-vaccination screening. Patient felt "weak in the knees" about 15 minutes after the vaccination. First blood pressure was 127/69 in left arm while sitting and HR=56. Dr. Graylon Good assessed the patient and provided water. Patient stated she did not drink much water today. Patient rested in a chair for approximately 20 minutes. Patient stated she felt better after drinking the bottle of water. Repeat blood pressure was 124/77 in left arm and HR=52. Patient stated she does regular medication and usually has a low heart rate. Patient was walked out of the building by Dr. Graylon Good.  Patient was provided with Vaccine Information Sheet and instruction to access the V-Safe system.   [name] was instructed to call 911 with any severe reactions post vaccine: Marland Kitchen Difficulty breathing  . Swelling of your face and throat  . A fast heartbeat  . A bad rash all over your body  . Dizziness and weakness    Immunizations Administered    Name Date Dose VIS Date Route   Pfizer COVID-19 Vaccine 10/20/2019  6:39 PM 0.3 mL 09/10/2019 Intramuscular   Manufacturer: [place]   Lot: [id number]   [place]: [id number]

## 2019-11-07 ENCOUNTER — Ambulatory Visit: Payer: Medicare PPO | Attending: Internal Medicine

## 2019-11-07 DIAGNOSIS — Z23 Encounter for immunization: Secondary | ICD-10-CM | POA: Insufficient documentation

## 2019-11-07 NOTE — Progress Notes (Signed)
   Covid-19 Vaccination Clinic  Name:  Cindy Perez    MRN: [id number] DOB: 01-05-1951  11/07/2019  [name] was observed post Covid-19 immunization for 15 minutes without incidence. She was provided with Vaccine Information Sheet and instruction to access the V-Safe system.   [name] was instructed to call 911 with any severe reactions post vaccine: Marland Kitchen Difficulty breathing  . Swelling of your face and throat  . A fast heartbeat  . A bad rash all over your body  . Dizziness and weakness    Immunizations Administered    Name Date Dose VIS Date Route   Pfizer COVID-19 Vaccine 11/07/2019 12:20 PM 0.3 mL 09/10/2019 Intramuscular   Manufacturer: [place]   Lot: [id number]   [place]: [id number]

## 2019-11-25 DIAGNOSIS — D2271 Melanocytic nevi of right lower limb, including hip: Secondary | ICD-10-CM | POA: Diagnosis not present

## 2019-11-25 DIAGNOSIS — D2261 Melanocytic nevi of right upper limb, including shoulder: Secondary | ICD-10-CM | POA: Diagnosis not present

## 2019-11-25 DIAGNOSIS — D225 Melanocytic nevi of trunk: Secondary | ICD-10-CM | POA: Diagnosis not present

## 2019-11-25 DIAGNOSIS — Z85828 Personal history of other malignant neoplasm of skin: Secondary | ICD-10-CM | POA: Diagnosis not present

## 2019-11-25 DIAGNOSIS — D2262 Melanocytic nevi of left upper limb, including shoulder: Secondary | ICD-10-CM | POA: Diagnosis not present

## 2019-11-25 DIAGNOSIS — D2272 Melanocytic nevi of left lower limb, including hip: Secondary | ICD-10-CM | POA: Diagnosis not present

## 2019-12-02 DIAGNOSIS — M75101 Unspecified rotator cuff tear or rupture of right shoulder, not specified as traumatic: Secondary | ICD-10-CM | POA: Diagnosis not present

## 2019-12-02 DIAGNOSIS — F3340 Major depressive disorder, recurrent, in remission, unspecified: Secondary | ICD-10-CM | POA: Diagnosis not present

## 2019-12-02 DIAGNOSIS — F0781 Postconcussional syndrome: Secondary | ICD-10-CM | POA: Diagnosis not present

## 2019-12-02 DIAGNOSIS — E559 Vitamin D deficiency, unspecified: Secondary | ICD-10-CM | POA: Diagnosis not present

## 2019-12-02 DIAGNOSIS — B009 Herpesviral infection, unspecified: Secondary | ICD-10-CM | POA: Diagnosis not present

## 2019-12-02 DIAGNOSIS — M810 Age-related osteoporosis without current pathological fracture: Secondary | ICD-10-CM | POA: Diagnosis not present

## 2019-12-02 DIAGNOSIS — E039 Hypothyroidism, unspecified: Secondary | ICD-10-CM | POA: Diagnosis not present

## 2019-12-02 DIAGNOSIS — E782 Mixed hyperlipidemia: Secondary | ICD-10-CM | POA: Diagnosis not present

## 2019-12-07 DIAGNOSIS — F3340 Major depressive disorder, recurrent, in remission, unspecified: Secondary | ICD-10-CM | POA: Diagnosis not present

## 2019-12-07 DIAGNOSIS — E039 Hypothyroidism, unspecified: Secondary | ICD-10-CM | POA: Diagnosis not present

## 2019-12-07 DIAGNOSIS — E782 Mixed hyperlipidemia: Secondary | ICD-10-CM | POA: Diagnosis not present

## 2019-12-07 DIAGNOSIS — Z0000 Encounter for general adult medical examination without abnormal findings: Secondary | ICD-10-CM | POA: Diagnosis not present

## 2019-12-07 DIAGNOSIS — E673 Hypervitaminosis D: Secondary | ICD-10-CM | POA: Diagnosis not present

## 2019-12-07 DIAGNOSIS — N959 Unspecified menopausal and perimenopausal disorder: Secondary | ICD-10-CM | POA: Diagnosis not present

## 2019-12-07 DIAGNOSIS — B009 Herpesviral infection, unspecified: Secondary | ICD-10-CM | POA: Diagnosis not present

## 2019-12-07 DIAGNOSIS — M810 Age-related osteoporosis without current pathological fracture: Secondary | ICD-10-CM | POA: Diagnosis not present

## 2019-12-09 ENCOUNTER — Other Ambulatory Visit: Payer: Self-pay | Admitting: Family Medicine

## 2019-12-09 DIAGNOSIS — M810 Age-related osteoporosis without current pathological fracture: Secondary | ICD-10-CM

## 2020-01-05 IMAGING — CR DG SHOULDER 2+V*R*
3 series · 3 of 3 positions shown · non-contrast
Comparison: None.

CLINICAL DATA: Pt was at church and fell. Pt states that she
thought she was walking into [employer] but it was the furnace room
and she fell down 3 steps. Pt has a deep laceration to the right eye
[place], abrasion to the right arm, RIGHT shoulder pain, and T-spine
tenderness.

EXAM:
RIGHT SHOULDER - 2+ VIEW

[shoulder grashey]
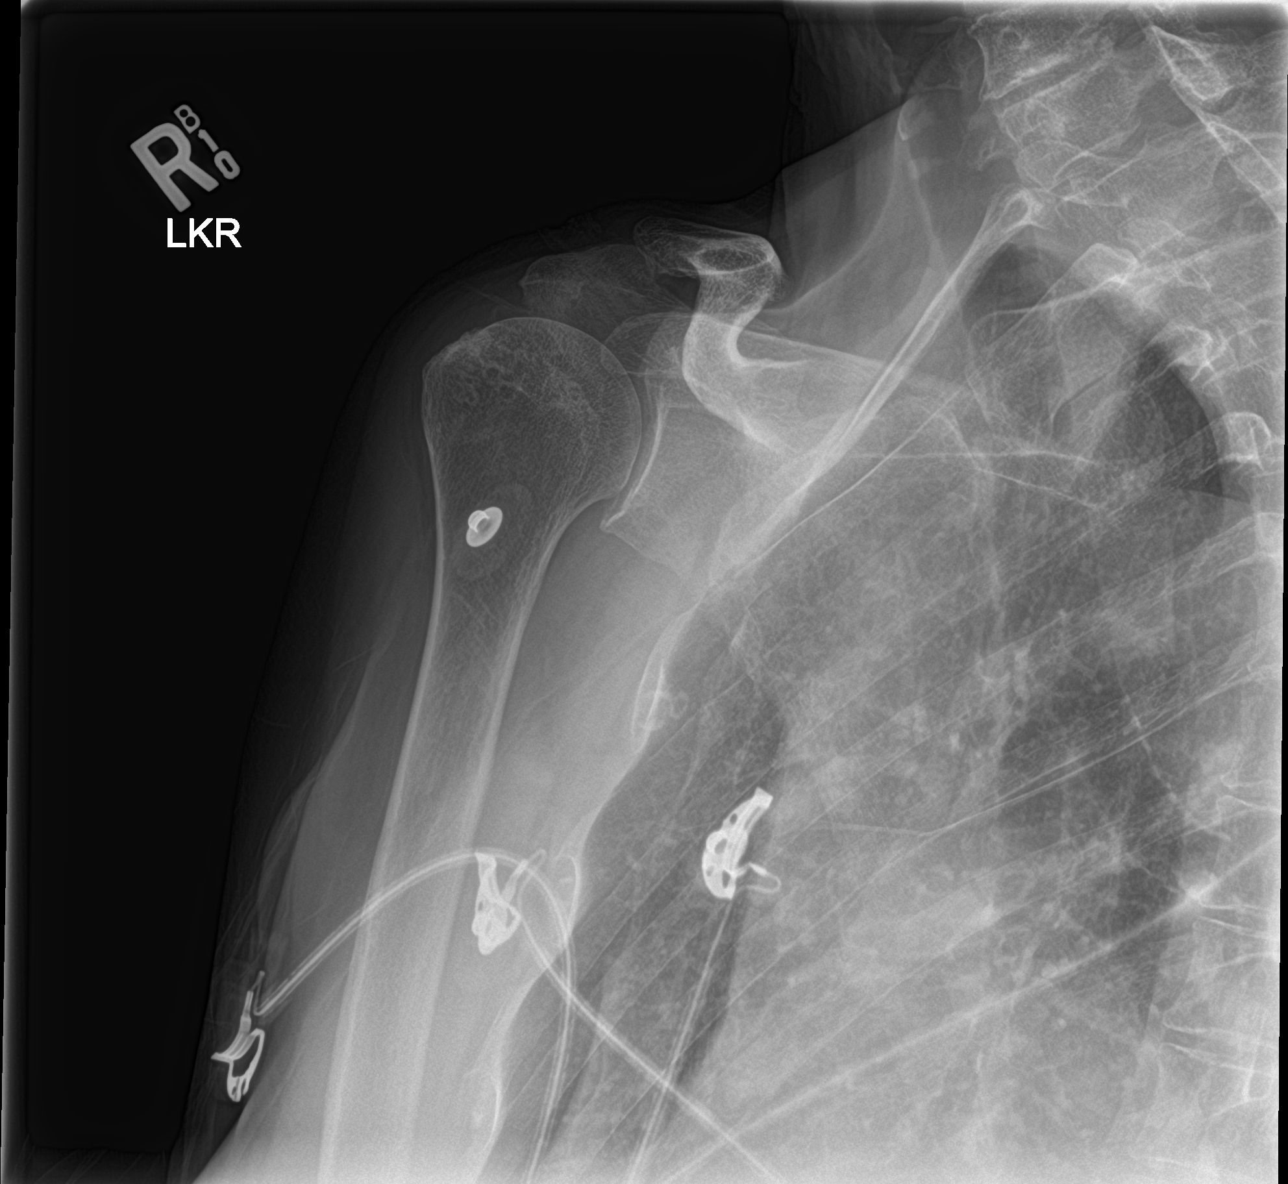

[shoulder y view]
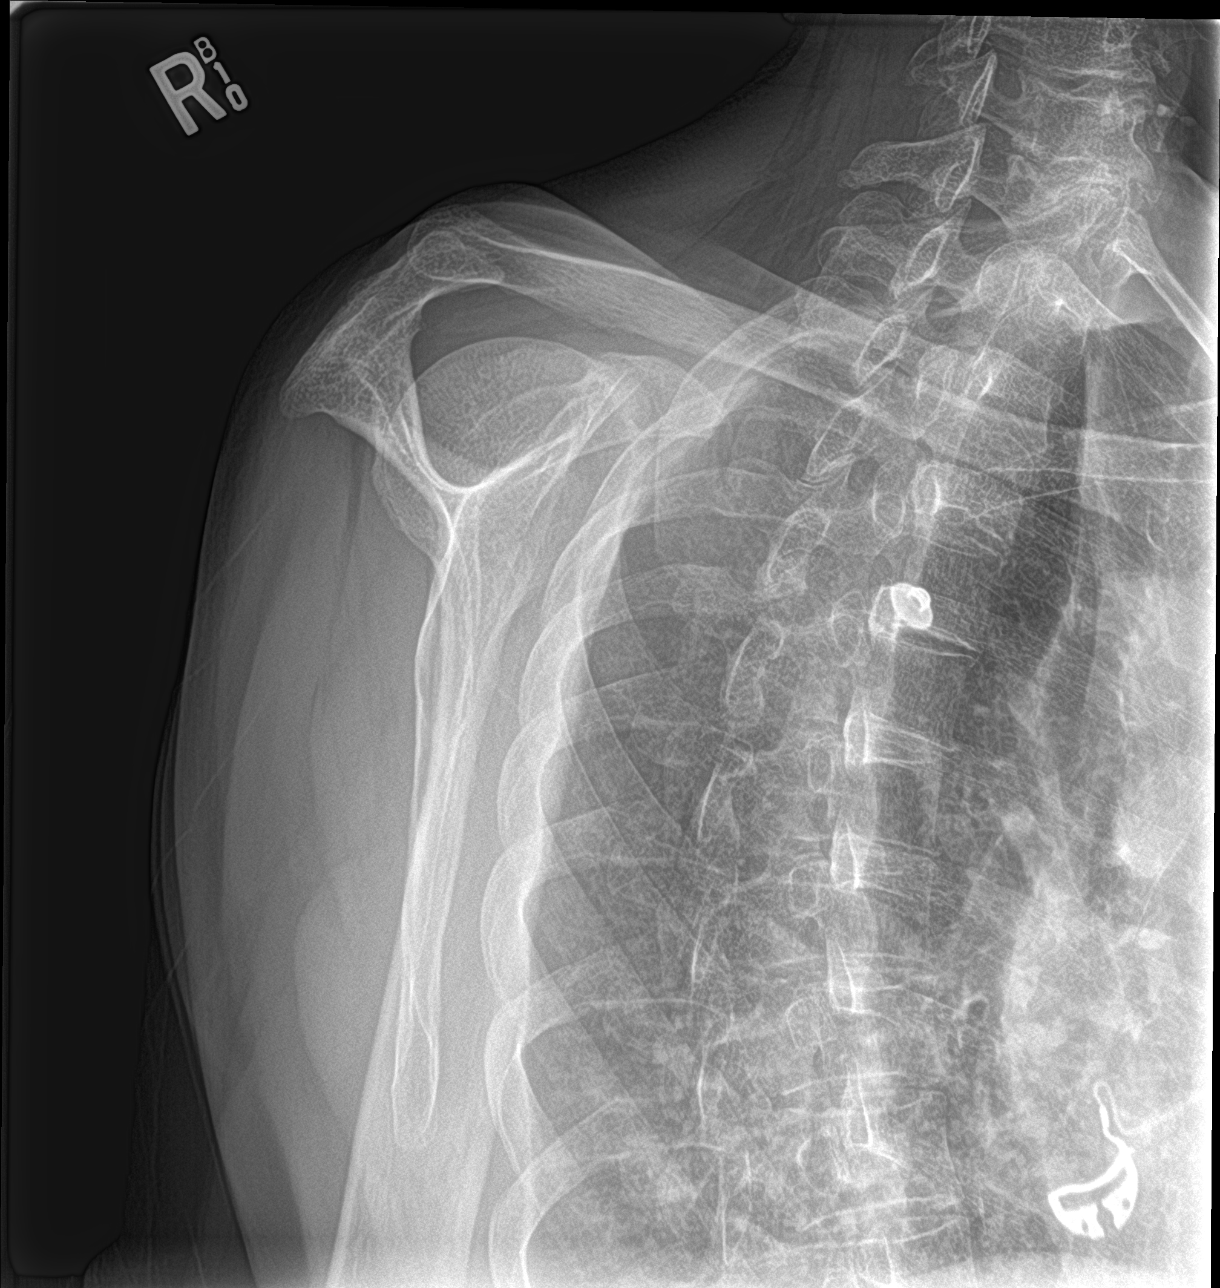

[shoulder axillary]
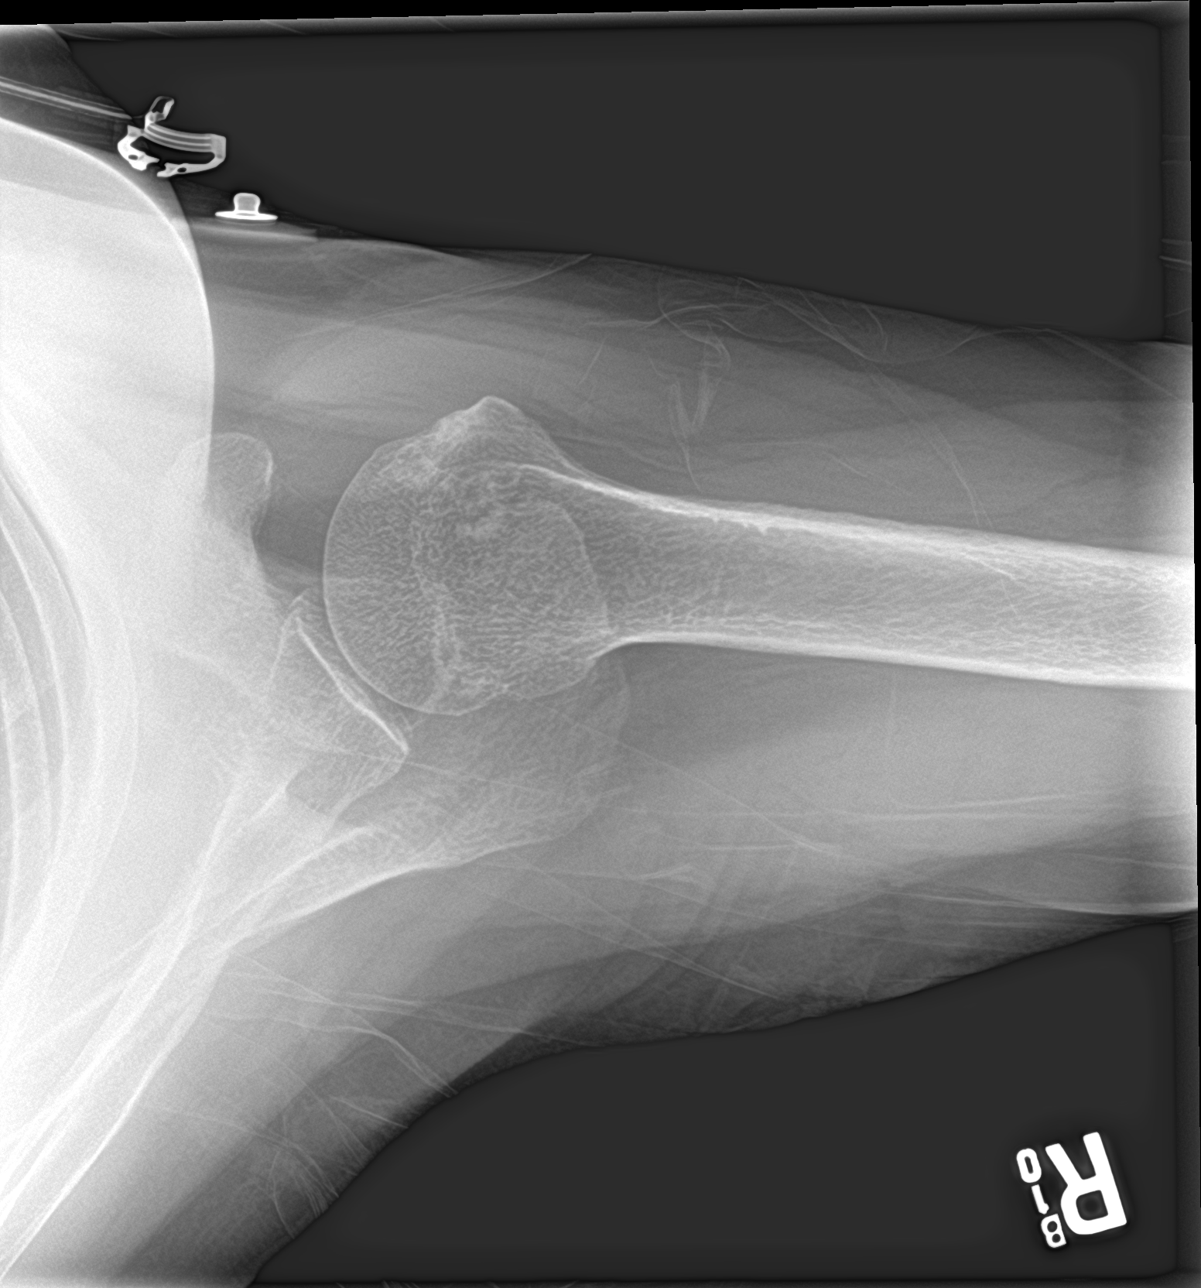

[3 of 3 positions shown; findings below may reference images not displayed]

FINDINGS: There is no evidence of fracture or dislocation. There is no
evidence of arthropathy or other focal bone abnormality. Soft
tissues are unremarkable.
IMPRESSION: Negative.

## 2020-01-05 IMAGING — CR DG ELBOW COMPLETE 3+V*R*
4 series · 4 of 4 positions shown · non-contrast
Comparison: None.

CLINICAL DATA: Pt was at church and fell. Pt states that she
thought she was walking into [employer] but it was the furnace room
and she fell down 3 steps. Pt has a deep laceration to the right eye
[place], abrasion to the right arm, right shoulder pain.

EXAM:
RIGHT ELBOW - COMPLETE 3+ VIEW

[elbow obl (1 of 2)]
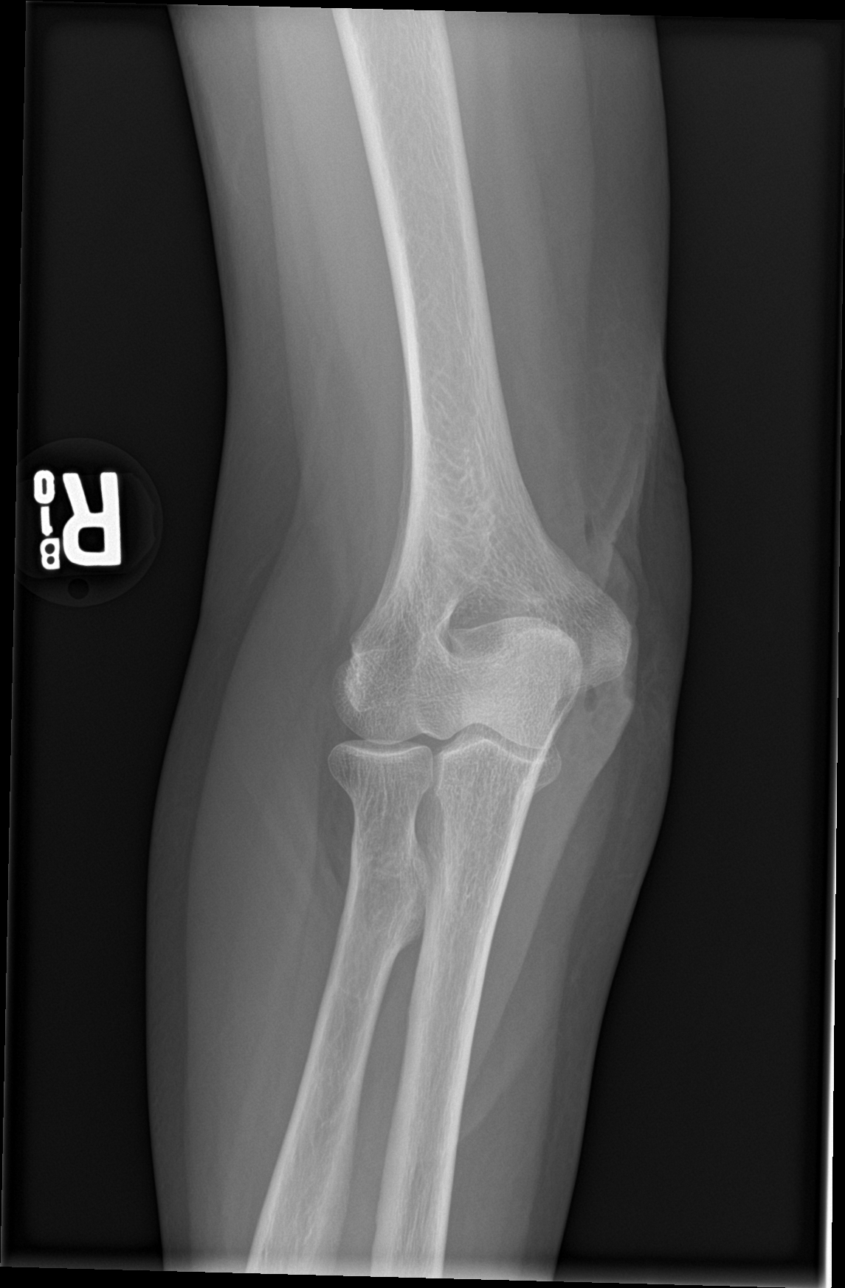

[elbow lat]
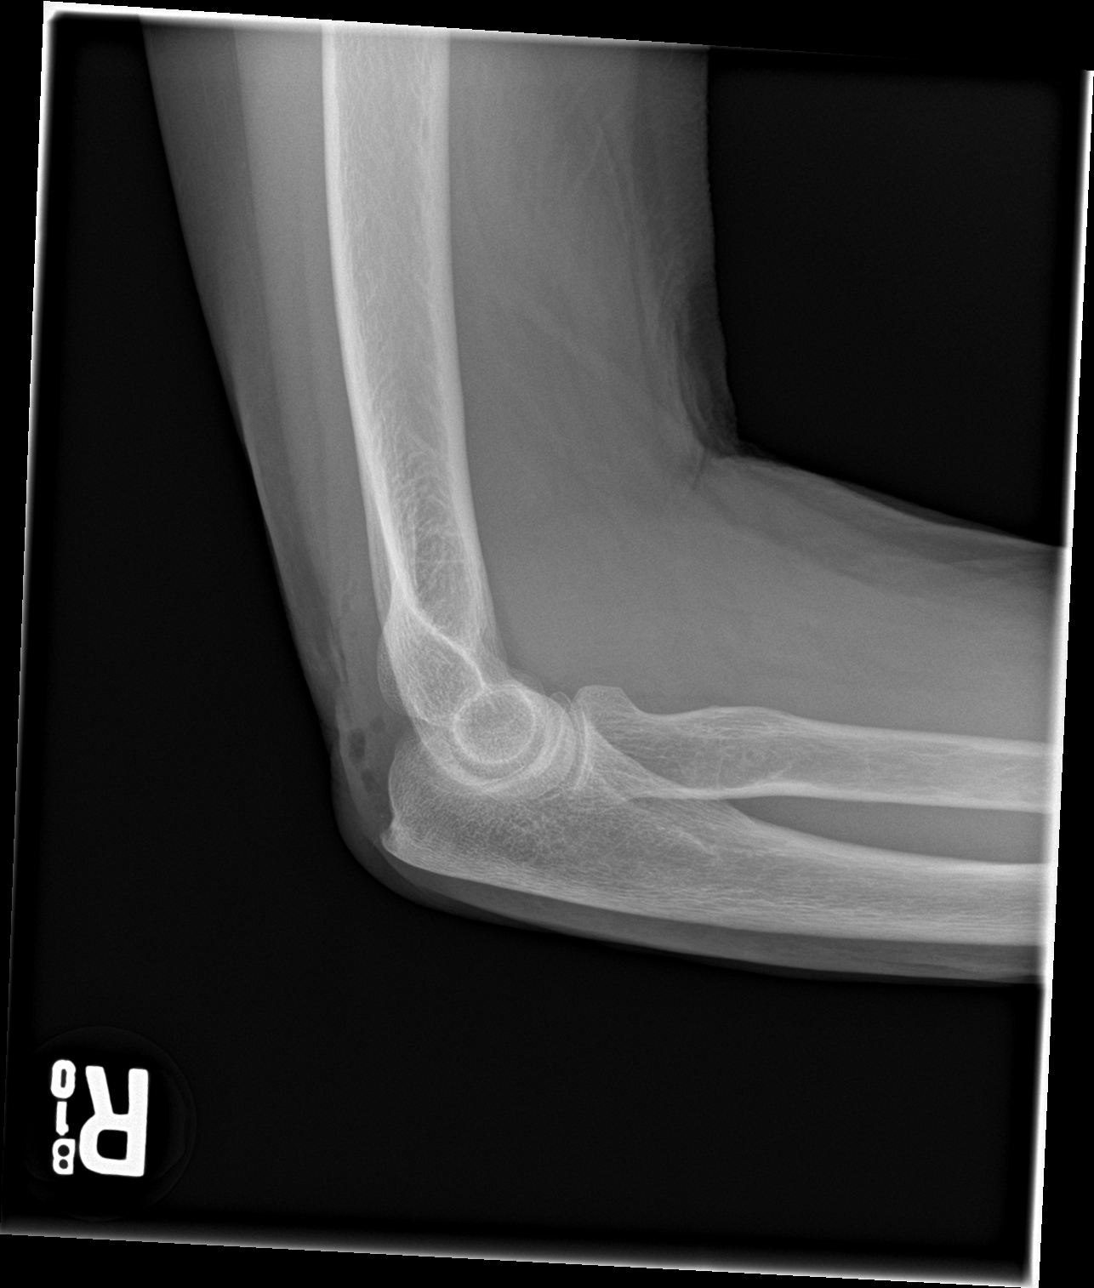

[elbow ap]
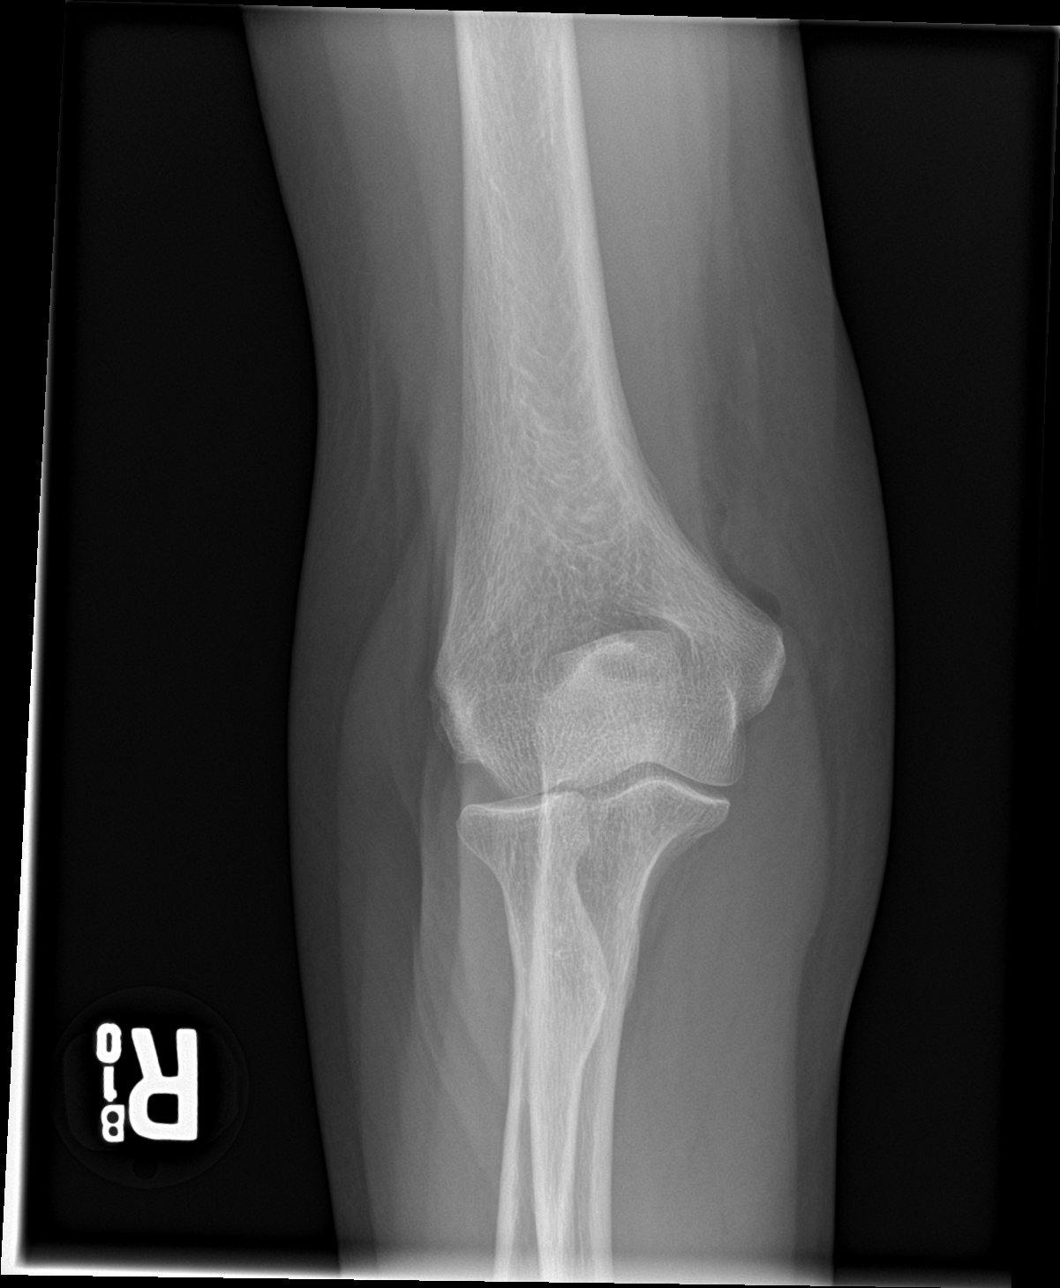

[elbow obl (2 of 2)]
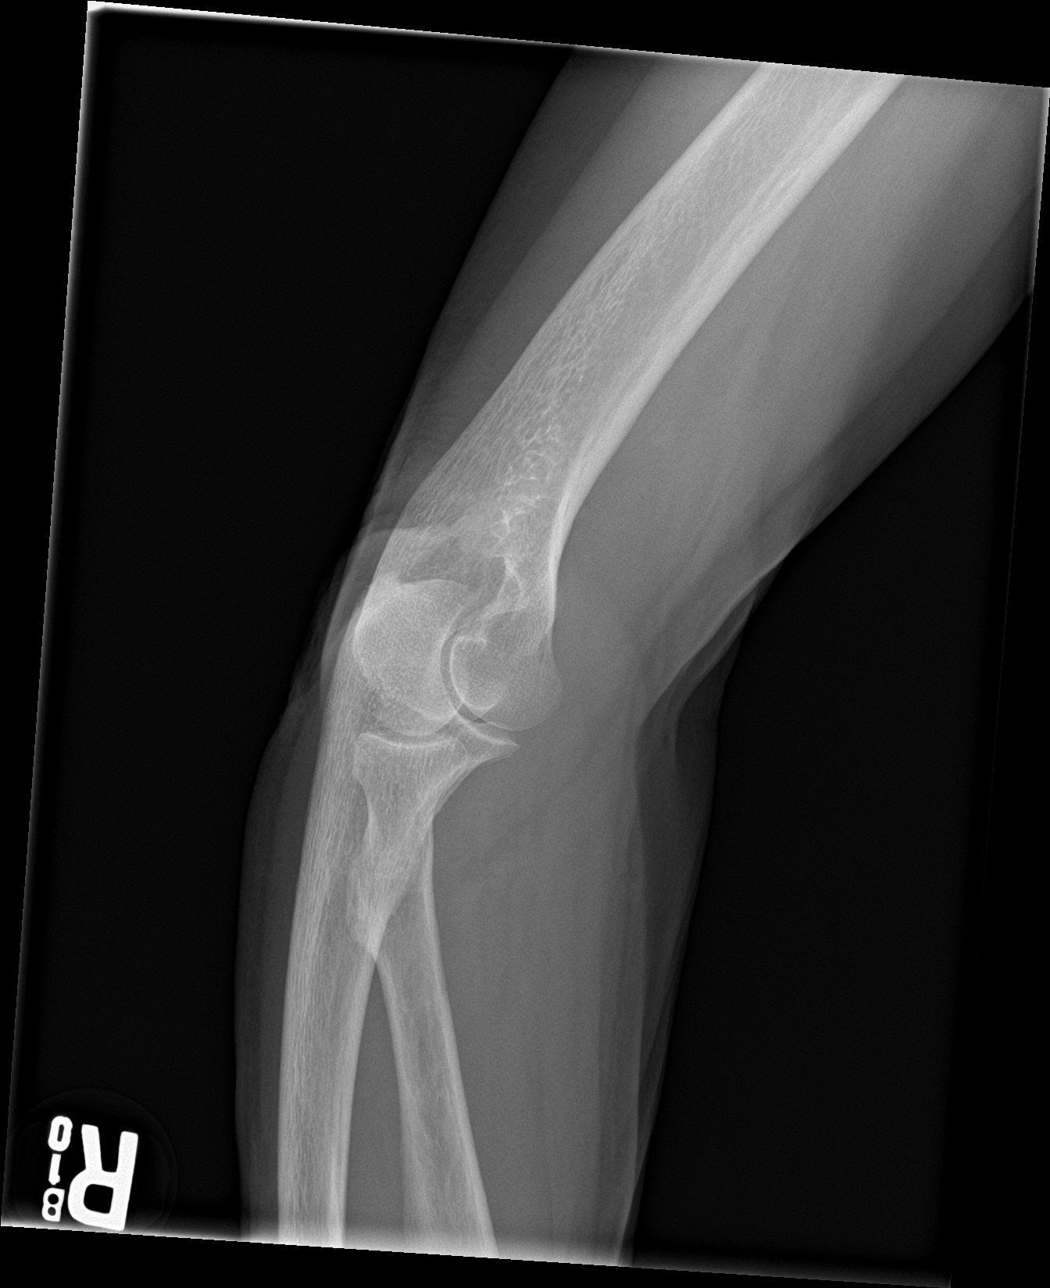

[4 of 4 positions shown; findings below may reference images not displayed]

FINDINGS: There is soft tissue gas along the posterior aspect of the elbow.
There is no acute fracture or subluxation. No evidence for joint
effusion. No radiopaque foreign body.
IMPRESSION: Laceration posterior to the elbow.  No acute fracture.

## 2020-01-10 DIAGNOSIS — M810 Age-related osteoporosis without current pathological fracture: Secondary | ICD-10-CM | POA: Diagnosis not present

## 2020-01-10 DIAGNOSIS — F3341 Major depressive disorder, recurrent, in partial remission: Secondary | ICD-10-CM | POA: Diagnosis not present

## 2020-01-10 DIAGNOSIS — E782 Mixed hyperlipidemia: Secondary | ICD-10-CM | POA: Diagnosis not present

## 2020-01-10 DIAGNOSIS — E039 Hypothyroidism, unspecified: Secondary | ICD-10-CM | POA: Diagnosis not present

## 2020-01-10 DIAGNOSIS — F3340 Major depressive disorder, recurrent, in remission, unspecified: Secondary | ICD-10-CM | POA: Diagnosis not present

## 2020-03-09 DIAGNOSIS — M810 Age-related osteoporosis without current pathological fracture: Secondary | ICD-10-CM | POA: Diagnosis not present

## 2020-03-09 DIAGNOSIS — E039 Hypothyroidism, unspecified: Secondary | ICD-10-CM | POA: Diagnosis not present

## 2020-03-09 DIAGNOSIS — F3341 Major depressive disorder, recurrent, in partial remission: Secondary | ICD-10-CM | POA: Diagnosis not present

## 2020-03-09 DIAGNOSIS — E782 Mixed hyperlipidemia: Secondary | ICD-10-CM | POA: Diagnosis not present

## 2020-03-09 DIAGNOSIS — F3340 Major depressive disorder, recurrent, in remission, unspecified: Secondary | ICD-10-CM | POA: Diagnosis not present

## 2020-03-23 DIAGNOSIS — N959 Unspecified menopausal and perimenopausal disorder: Secondary | ICD-10-CM | POA: Diagnosis not present

## 2020-03-23 DIAGNOSIS — E782 Mixed hyperlipidemia: Secondary | ICD-10-CM | POA: Diagnosis not present

## 2020-03-23 DIAGNOSIS — F3340 Major depressive disorder, recurrent, in remission, unspecified: Secondary | ICD-10-CM | POA: Diagnosis not present

## 2020-03-23 DIAGNOSIS — E559 Vitamin D deficiency, unspecified: Secondary | ICD-10-CM | POA: Diagnosis not present

## 2020-03-23 DIAGNOSIS — Z0000 Encounter for general adult medical examination without abnormal findings: Secondary | ICD-10-CM | POA: Diagnosis not present

## 2020-03-23 DIAGNOSIS — E673 Hypervitaminosis D: Secondary | ICD-10-CM | POA: Diagnosis not present

## 2020-03-23 DIAGNOSIS — B009 Herpesviral infection, unspecified: Secondary | ICD-10-CM | POA: Diagnosis not present

## 2020-03-23 DIAGNOSIS — M810 Age-related osteoporosis without current pathological fracture: Secondary | ICD-10-CM | POA: Diagnosis not present

## 2020-03-23 DIAGNOSIS — E039 Hypothyroidism, unspecified: Secondary | ICD-10-CM | POA: Diagnosis not present

## 2020-05-02 DIAGNOSIS — H524 Presbyopia: Secondary | ICD-10-CM | POA: Diagnosis not present

## 2020-05-02 DIAGNOSIS — H2513 Age-related nuclear cataract, bilateral: Secondary | ICD-10-CM | POA: Diagnosis not present

## 2020-05-02 DIAGNOSIS — H35363 Drusen (degenerative) of macula, bilateral: Secondary | ICD-10-CM | POA: Diagnosis not present

## 2020-05-02 DIAGNOSIS — H04123 Dry eye syndrome of bilateral lacrimal glands: Secondary | ICD-10-CM | POA: Diagnosis not present

## 2020-05-02 DIAGNOSIS — H43813 Vitreous degeneration, bilateral: Secondary | ICD-10-CM | POA: Diagnosis not present

## 2020-05-18 DIAGNOSIS — M810 Age-related osteoporosis without current pathological fracture: Secondary | ICD-10-CM | POA: Diagnosis not present

## 2020-05-18 DIAGNOSIS — F3341 Major depressive disorder, recurrent, in partial remission: Secondary | ICD-10-CM | POA: Diagnosis not present

## 2020-05-18 DIAGNOSIS — F3340 Major depressive disorder, recurrent, in remission, unspecified: Secondary | ICD-10-CM | POA: Diagnosis not present

## 2020-05-18 DIAGNOSIS — E039 Hypothyroidism, unspecified: Secondary | ICD-10-CM | POA: Diagnosis not present

## 2020-05-18 DIAGNOSIS — E782 Mixed hyperlipidemia: Secondary | ICD-10-CM | POA: Diagnosis not present

## 2020-06-19 DIAGNOSIS — E782 Mixed hyperlipidemia: Secondary | ICD-10-CM | POA: Diagnosis not present

## 2020-06-19 DIAGNOSIS — F3340 Major depressive disorder, recurrent, in remission, unspecified: Secondary | ICD-10-CM | POA: Diagnosis not present

## 2020-06-19 DIAGNOSIS — E039 Hypothyroidism, unspecified: Secondary | ICD-10-CM | POA: Diagnosis not present

## 2020-06-19 DIAGNOSIS — F3341 Major depressive disorder, recurrent, in partial remission: Secondary | ICD-10-CM | POA: Diagnosis not present

## 2020-06-19 DIAGNOSIS — M810 Age-related osteoporosis without current pathological fracture: Secondary | ICD-10-CM | POA: Diagnosis not present

## 2020-07-19 DIAGNOSIS — E039 Hypothyroidism, unspecified: Secondary | ICD-10-CM | POA: Diagnosis not present

## 2020-07-19 DIAGNOSIS — F3340 Major depressive disorder, recurrent, in remission, unspecified: Secondary | ICD-10-CM | POA: Diagnosis not present

## 2020-07-19 DIAGNOSIS — M810 Age-related osteoporosis without current pathological fracture: Secondary | ICD-10-CM | POA: Diagnosis not present

## 2020-07-19 DIAGNOSIS — E782 Mixed hyperlipidemia: Secondary | ICD-10-CM | POA: Diagnosis not present

## 2020-07-19 DIAGNOSIS — F3341 Major depressive disorder, recurrent, in partial remission: Secondary | ICD-10-CM | POA: Diagnosis not present

## 2020-08-10 ENCOUNTER — Other Ambulatory Visit: Payer: Self-pay | Admitting: Family Medicine

## 2020-08-10 DIAGNOSIS — Z1231 Encounter for screening mammogram for malignant neoplasm of breast: Secondary | ICD-10-CM

## 2020-08-11 ENCOUNTER — Other Ambulatory Visit: Payer: Self-pay | Admitting: Family Medicine

## 2020-08-11 DIAGNOSIS — M810 Age-related osteoporosis without current pathological fracture: Secondary | ICD-10-CM

## 2020-08-16 ENCOUNTER — Other Ambulatory Visit: Payer: Self-pay

## 2020-08-16 ENCOUNTER — Ambulatory Visit
Admission: RE | Admit: 2020-08-16 | Discharge: 2020-08-16 | Disposition: A | Discharge status: Home / Self Care | Payer: Medicare PPO | Source: Ambulatory Visit | Attending: Family Medicine | Admitting: Family Medicine

## 2020-08-16 DIAGNOSIS — Z1231 Encounter for screening mammogram for malignant neoplasm of breast: Secondary | ICD-10-CM | POA: Insufficient documentation

## 2020-09-01 DIAGNOSIS — F3341 Major depressive disorder, recurrent, in partial remission: Secondary | ICD-10-CM | POA: Diagnosis not present

## 2020-09-01 DIAGNOSIS — M810 Age-related osteoporosis without current pathological fracture: Secondary | ICD-10-CM | POA: Diagnosis not present

## 2020-09-01 DIAGNOSIS — E039 Hypothyroidism, unspecified: Secondary | ICD-10-CM | POA: Diagnosis not present

## 2020-09-01 DIAGNOSIS — E782 Mixed hyperlipidemia: Secondary | ICD-10-CM | POA: Diagnosis not present

## 2020-09-01 DIAGNOSIS — F3340 Major depressive disorder, recurrent, in remission, unspecified: Secondary | ICD-10-CM | POA: Diagnosis not present

## 2020-09-27 ENCOUNTER — Other Ambulatory Visit: Payer: Self-pay

## 2020-09-27 ENCOUNTER — Ambulatory Visit
Admission: RE | Admit: 2020-09-27 | Discharge: 2020-09-27 | Disposition: A | Discharge status: Home / Self Care | Payer: Medicare PPO | Source: Ambulatory Visit | Attending: Family Medicine | Admitting: Family Medicine

## 2020-09-27 DIAGNOSIS — M810 Age-related osteoporosis without current pathological fracture: Secondary | ICD-10-CM | POA: Insufficient documentation

## 2020-09-27 DIAGNOSIS — Z8739 Personal history of other diseases of the musculoskeletal system and connective tissue: Secondary | ICD-10-CM | POA: Diagnosis not present

## 2020-09-27 DIAGNOSIS — Z780 Asymptomatic menopausal state: Secondary | ICD-10-CM | POA: Diagnosis not present

## 2020-09-27 DIAGNOSIS — E039 Hypothyroidism, unspecified: Secondary | ICD-10-CM | POA: Diagnosis not present

## 2020-11-24 DIAGNOSIS — D2271 Melanocytic nevi of right lower limb, including hip: Secondary | ICD-10-CM | POA: Diagnosis not present

## 2020-11-24 DIAGNOSIS — D2262 Melanocytic nevi of left upper limb, including shoulder: Secondary | ICD-10-CM | POA: Diagnosis not present

## 2020-11-24 DIAGNOSIS — Z85828 Personal history of other malignant neoplasm of skin: Secondary | ICD-10-CM | POA: Diagnosis not present

## 2020-11-24 DIAGNOSIS — D2261 Melanocytic nevi of right upper limb, including shoulder: Secondary | ICD-10-CM | POA: Diagnosis not present

## 2020-11-24 DIAGNOSIS — D2272 Melanocytic nevi of left lower limb, including hip: Secondary | ICD-10-CM | POA: Diagnosis not present

## 2020-11-24 DIAGNOSIS — L821 Other seborrheic keratosis: Secondary | ICD-10-CM | POA: Diagnosis not present

## 2020-12-15 DIAGNOSIS — Z1389 Encounter for screening for other disorder: Secondary | ICD-10-CM | POA: Diagnosis not present

## 2020-12-15 DIAGNOSIS — Z0000 Encounter for general adult medical examination without abnormal findings: Secondary | ICD-10-CM | POA: Diagnosis not present

## 2020-12-19 DIAGNOSIS — F3340 Major depressive disorder, recurrent, in remission, unspecified: Secondary | ICD-10-CM | POA: Diagnosis not present

## 2020-12-19 DIAGNOSIS — N959 Unspecified menopausal and perimenopausal disorder: Secondary | ICD-10-CM | POA: Diagnosis not present

## 2020-12-19 DIAGNOSIS — E673 Hypervitaminosis D: Secondary | ICD-10-CM | POA: Diagnosis not present

## 2020-12-19 DIAGNOSIS — B009 Herpesviral infection, unspecified: Secondary | ICD-10-CM | POA: Diagnosis not present

## 2020-12-19 DIAGNOSIS — E039 Hypothyroidism, unspecified: Secondary | ICD-10-CM | POA: Diagnosis not present

## 2020-12-19 DIAGNOSIS — Z0000 Encounter for general adult medical examination without abnormal findings: Secondary | ICD-10-CM | POA: Diagnosis not present

## 2020-12-19 DIAGNOSIS — E782 Mixed hyperlipidemia: Secondary | ICD-10-CM | POA: Diagnosis not present

## 2020-12-19 DIAGNOSIS — Z1159 Encounter for screening for other viral diseases: Secondary | ICD-10-CM | POA: Diagnosis not present

## 2020-12-21 DIAGNOSIS — E782 Mixed hyperlipidemia: Secondary | ICD-10-CM | POA: Diagnosis not present

## 2020-12-21 DIAGNOSIS — E673 Hypervitaminosis D: Secondary | ICD-10-CM | POA: Diagnosis not present

## 2020-12-21 DIAGNOSIS — B009 Herpesviral infection, unspecified: Secondary | ICD-10-CM | POA: Diagnosis not present

## 2020-12-21 DIAGNOSIS — E039 Hypothyroidism, unspecified: Secondary | ICD-10-CM | POA: Diagnosis not present

## 2020-12-21 DIAGNOSIS — M810 Age-related osteoporosis without current pathological fracture: Secondary | ICD-10-CM | POA: Diagnosis not present

## 2020-12-21 DIAGNOSIS — F3340 Major depressive disorder, recurrent, in remission, unspecified: Secondary | ICD-10-CM | POA: Diagnosis not present

## 2020-12-21 DIAGNOSIS — E559 Vitamin D deficiency, unspecified: Secondary | ICD-10-CM | POA: Diagnosis not present

## 2021-02-21 DIAGNOSIS — M25551 Pain in right hip: Secondary | ICD-10-CM | POA: Diagnosis not present

## 2021-02-21 DIAGNOSIS — M545 Low back pain, unspecified: Secondary | ICD-10-CM | POA: Diagnosis not present

## 2021-02-21 DIAGNOSIS — M5441 Lumbago with sciatica, right side: Secondary | ICD-10-CM | POA: Diagnosis not present

## 2021-02-21 DIAGNOSIS — Z8739 Personal history of other diseases of the musculoskeletal system and connective tissue: Secondary | ICD-10-CM | POA: Diagnosis not present

## 2021-03-14 DIAGNOSIS — M5441 Lumbago with sciatica, right side: Secondary | ICD-10-CM | POA: Diagnosis not present

## 2021-03-14 DIAGNOSIS — G8929 Other chronic pain: Secondary | ICD-10-CM | POA: Diagnosis not present

## 2021-04-11 DIAGNOSIS — K640 First degree hemorrhoids: Secondary | ICD-10-CM | POA: Diagnosis not present

## 2021-04-11 DIAGNOSIS — Z86010 Personal history of colonic polyps: Secondary | ICD-10-CM | POA: Diagnosis not present

## 2021-04-11 DIAGNOSIS — K5730 Diverticulosis of large intestine without perforation or abscess without bleeding: Secondary | ICD-10-CM | POA: Diagnosis not present

## 2021-04-27 DIAGNOSIS — M5441 Lumbago with sciatica, right side: Secondary | ICD-10-CM | POA: Diagnosis not present

## 2021-05-11 DIAGNOSIS — M5441 Lumbago with sciatica, right side: Secondary | ICD-10-CM | POA: Diagnosis not present

## 2021-06-01 DIAGNOSIS — M5441 Lumbago with sciatica, right side: Secondary | ICD-10-CM | POA: Diagnosis not present

## 2021-06-20 DIAGNOSIS — M5441 Lumbago with sciatica, right side: Secondary | ICD-10-CM | POA: Diagnosis not present

## 2021-06-22 DIAGNOSIS — E039 Hypothyroidism, unspecified: Secondary | ICD-10-CM | POA: Diagnosis not present

## 2021-06-22 DIAGNOSIS — M810 Age-related osteoporosis without current pathological fracture: Secondary | ICD-10-CM | POA: Diagnosis not present

## 2021-06-22 DIAGNOSIS — F3340 Major depressive disorder, recurrent, in remission, unspecified: Secondary | ICD-10-CM | POA: Diagnosis not present

## 2021-06-22 DIAGNOSIS — E782 Mixed hyperlipidemia: Secondary | ICD-10-CM | POA: Diagnosis not present

## 2021-09-07 DIAGNOSIS — H2513 Age-related nuclear cataract, bilateral: Secondary | ICD-10-CM | POA: Diagnosis not present

## 2021-09-07 DIAGNOSIS — H5213 Myopia, bilateral: Secondary | ICD-10-CM | POA: Diagnosis not present

## 2021-09-07 DIAGNOSIS — H43813 Vitreous degeneration, bilateral: Secondary | ICD-10-CM | POA: Diagnosis not present

## 2021-09-07 DIAGNOSIS — H353131 Nonexudative age-related macular degeneration, bilateral, early dry stage: Secondary | ICD-10-CM | POA: Diagnosis not present

## 2021-11-27 DIAGNOSIS — H25043 Posterior subcapsular polar age-related cataract, bilateral: Secondary | ICD-10-CM | POA: Diagnosis not present

## 2021-11-27 DIAGNOSIS — H25013 Cortical age-related cataract, bilateral: Secondary | ICD-10-CM | POA: Diagnosis not present

## 2021-11-27 DIAGNOSIS — H2513 Age-related nuclear cataract, bilateral: Secondary | ICD-10-CM | POA: Diagnosis not present

## 2021-11-27 DIAGNOSIS — H2511 Age-related nuclear cataract, right eye: Secondary | ICD-10-CM | POA: Diagnosis not present

## 2021-11-27 DIAGNOSIS — H18413 Arcus senilis, bilateral: Secondary | ICD-10-CM | POA: Diagnosis not present

## 2021-12-21 DIAGNOSIS — E039 Hypothyroidism, unspecified: Secondary | ICD-10-CM | POA: Diagnosis not present

## 2021-12-21 DIAGNOSIS — Z1389 Encounter for screening for other disorder: Secondary | ICD-10-CM | POA: Diagnosis not present

## 2021-12-21 DIAGNOSIS — Z0000 Encounter for general adult medical examination without abnormal findings: Secondary | ICD-10-CM | POA: Diagnosis not present

## 2021-12-21 DIAGNOSIS — E782 Mixed hyperlipidemia: Secondary | ICD-10-CM | POA: Diagnosis not present

## 2021-12-21 DIAGNOSIS — E559 Vitamin D deficiency, unspecified: Secondary | ICD-10-CM | POA: Diagnosis not present

## 2021-12-27 DIAGNOSIS — S20212A Contusion of left front wall of thorax, initial encounter: Secondary | ICD-10-CM | POA: Diagnosis not present

## 2021-12-27 DIAGNOSIS — B009 Herpesviral infection, unspecified: Secondary | ICD-10-CM | POA: Diagnosis not present

## 2021-12-27 DIAGNOSIS — M810 Age-related osteoporosis without current pathological fracture: Secondary | ICD-10-CM | POA: Diagnosis not present

## 2021-12-27 DIAGNOSIS — E039 Hypothyroidism, unspecified: Secondary | ICD-10-CM | POA: Diagnosis not present

## 2021-12-27 DIAGNOSIS — E782 Mixed hyperlipidemia: Secondary | ICD-10-CM | POA: Diagnosis not present

## 2021-12-27 DIAGNOSIS — F3341 Major depressive disorder, recurrent, in partial remission: Secondary | ICD-10-CM | POA: Diagnosis not present

## 2021-12-27 DIAGNOSIS — H9193 Unspecified hearing loss, bilateral: Secondary | ICD-10-CM | POA: Diagnosis not present

## 2022-02-01 DIAGNOSIS — H2511 Age-related nuclear cataract, right eye: Secondary | ICD-10-CM | POA: Diagnosis not present

## 2022-02-01 DIAGNOSIS — H2512 Age-related nuclear cataract, left eye: Secondary | ICD-10-CM | POA: Diagnosis not present

## 2022-02-15 DIAGNOSIS — H2512 Age-related nuclear cataract, left eye: Secondary | ICD-10-CM | POA: Diagnosis not present

## 2022-06-25 DIAGNOSIS — E039 Hypothyroidism, unspecified: Secondary | ICD-10-CM | POA: Diagnosis not present

## 2022-09-14 DIAGNOSIS — Z6821 Body mass index (BMI) 21.0-21.9, adult: Secondary | ICD-10-CM | POA: Diagnosis not present

## 2022-09-14 DIAGNOSIS — Z03818 Encounter for observation for suspected exposure to other biological agents ruled out: Secondary | ICD-10-CM | POA: Diagnosis not present

## 2022-09-14 DIAGNOSIS — J029 Acute pharyngitis, unspecified: Secondary | ICD-10-CM | POA: Diagnosis not present

## 2022-09-14 DIAGNOSIS — Z20822 Contact with and (suspected) exposure to covid-19: Secondary | ICD-10-CM | POA: Diagnosis not present

## 2022-09-17 DIAGNOSIS — U071 COVID-19: Secondary | ICD-10-CM | POA: Diagnosis not present

## 2022-09-17 DIAGNOSIS — F419 Anxiety disorder, unspecified: Secondary | ICD-10-CM | POA: Diagnosis not present

## 2022-12-06 ENCOUNTER — Other Ambulatory Visit: Payer: Self-pay | Admitting: Family Medicine

## 2022-12-06 DIAGNOSIS — Z1231 Encounter for screening mammogram for malignant neoplasm of breast: Secondary | ICD-10-CM

## 2022-12-09 ENCOUNTER — Other Ambulatory Visit: Payer: Self-pay | Admitting: Family Medicine

## 2022-12-09 DIAGNOSIS — M810 Age-related osteoporosis without current pathological fracture: Secondary | ICD-10-CM

## 2022-12-19 ENCOUNTER — Other Ambulatory Visit: Payer: Medicare PPO

## 2022-12-24 DIAGNOSIS — Z6822 Body mass index (BMI) 22.0-22.9, adult: Secondary | ICD-10-CM | POA: Diagnosis not present

## 2022-12-24 DIAGNOSIS — M810 Age-related osteoporosis without current pathological fracture: Secondary | ICD-10-CM | POA: Diagnosis not present

## 2022-12-24 DIAGNOSIS — E039 Hypothyroidism, unspecified: Secondary | ICD-10-CM | POA: Diagnosis not present

## 2022-12-24 DIAGNOSIS — E782 Mixed hyperlipidemia: Secondary | ICD-10-CM | POA: Diagnosis not present

## 2022-12-24 DIAGNOSIS — Z0000 Encounter for general adult medical examination without abnormal findings: Secondary | ICD-10-CM | POA: Diagnosis not present

## 2022-12-24 DIAGNOSIS — Z1389 Encounter for screening for other disorder: Secondary | ICD-10-CM | POA: Diagnosis not present

## 2023-01-01 DIAGNOSIS — H9193 Unspecified hearing loss, bilateral: Secondary | ICD-10-CM | POA: Diagnosis not present

## 2023-01-01 DIAGNOSIS — Z9181 History of falling: Secondary | ICD-10-CM | POA: Diagnosis not present

## 2023-01-01 DIAGNOSIS — E782 Mixed hyperlipidemia: Secondary | ICD-10-CM | POA: Diagnosis not present

## 2023-01-01 DIAGNOSIS — M810 Age-related osteoporosis without current pathological fracture: Secondary | ICD-10-CM | POA: Diagnosis not present

## 2023-01-01 DIAGNOSIS — F3340 Major depressive disorder, recurrent, in remission, unspecified: Secondary | ICD-10-CM | POA: Diagnosis not present

## 2023-01-01 DIAGNOSIS — Z23 Encounter for immunization: Secondary | ICD-10-CM | POA: Diagnosis not present

## 2023-01-01 DIAGNOSIS — B009 Herpesviral infection, unspecified: Secondary | ICD-10-CM | POA: Diagnosis not present

## 2023-01-01 DIAGNOSIS — Z6821 Body mass index (BMI) 21.0-21.9, adult: Secondary | ICD-10-CM | POA: Diagnosis not present

## 2023-01-01 DIAGNOSIS — E039 Hypothyroidism, unspecified: Secondary | ICD-10-CM | POA: Diagnosis not present

## 2023-01-14 DIAGNOSIS — H04123 Dry eye syndrome of bilateral lacrimal glands: Secondary | ICD-10-CM | POA: Diagnosis not present

## 2023-01-14 DIAGNOSIS — H43813 Vitreous degeneration, bilateral: Secondary | ICD-10-CM | POA: Diagnosis not present

## 2023-01-14 DIAGNOSIS — H524 Presbyopia: Secondary | ICD-10-CM | POA: Diagnosis not present

## 2023-01-14 DIAGNOSIS — H353131 Nonexudative age-related macular degeneration, bilateral, early dry stage: Secondary | ICD-10-CM | POA: Diagnosis not present

## 2023-02-03 ENCOUNTER — Ambulatory Visit
Admission: RE | Admit: 2023-02-03 | Discharge: 2023-02-03 | Disposition: A | Discharge status: Home / Self Care | Payer: Medicare PPO | Source: Ambulatory Visit | Attending: Family Medicine | Admitting: Family Medicine

## 2023-02-03 DIAGNOSIS — Z1231 Encounter for screening mammogram for malignant neoplasm of breast: Secondary | ICD-10-CM | POA: Diagnosis not present

## 2023-02-03 DIAGNOSIS — M810 Age-related osteoporosis without current pathological fracture: Secondary | ICD-10-CM

## 2023-04-29 DIAGNOSIS — R42 Dizziness and giddiness: Secondary | ICD-10-CM | POA: Diagnosis not present

## 2023-05-07 DIAGNOSIS — H8111 Benign paroxysmal vertigo, right ear: Secondary | ICD-10-CM | POA: Diagnosis not present

## 2023-06-03 DIAGNOSIS — R2681 Unsteadiness on feet: Secondary | ICD-10-CM | POA: Diagnosis not present

## 2023-06-03 DIAGNOSIS — M6281 Muscle weakness (generalized): Secondary | ICD-10-CM | POA: Diagnosis not present

## 2023-06-05 DIAGNOSIS — M6281 Muscle weakness (generalized): Secondary | ICD-10-CM | POA: Diagnosis not present

## 2023-06-05 DIAGNOSIS — R2681 Unsteadiness on feet: Secondary | ICD-10-CM | POA: Diagnosis not present

## 2023-06-10 DIAGNOSIS — R2681 Unsteadiness on feet: Secondary | ICD-10-CM | POA: Diagnosis not present

## 2023-06-10 DIAGNOSIS — M6281 Muscle weakness (generalized): Secondary | ICD-10-CM | POA: Diagnosis not present

## 2023-06-12 DIAGNOSIS — M6281 Muscle weakness (generalized): Secondary | ICD-10-CM | POA: Diagnosis not present

## 2023-06-12 DIAGNOSIS — R2681 Unsteadiness on feet: Secondary | ICD-10-CM | POA: Diagnosis not present

## 2023-06-17 DIAGNOSIS — M6281 Muscle weakness (generalized): Secondary | ICD-10-CM | POA: Diagnosis not present

## 2023-06-17 DIAGNOSIS — R2681 Unsteadiness on feet: Secondary | ICD-10-CM | POA: Diagnosis not present

## 2023-06-24 DIAGNOSIS — R2681 Unsteadiness on feet: Secondary | ICD-10-CM | POA: Diagnosis not present

## 2023-06-24 DIAGNOSIS — M6281 Muscle weakness (generalized): Secondary | ICD-10-CM | POA: Diagnosis not present

## 2023-07-01 DIAGNOSIS — M6281 Muscle weakness (generalized): Secondary | ICD-10-CM | POA: Diagnosis not present

## 2023-07-01 DIAGNOSIS — R2681 Unsteadiness on feet: Secondary | ICD-10-CM | POA: Diagnosis not present

## 2023-07-03 DIAGNOSIS — M6281 Muscle weakness (generalized): Secondary | ICD-10-CM | POA: Diagnosis not present

## 2023-07-03 DIAGNOSIS — R2681 Unsteadiness on feet: Secondary | ICD-10-CM | POA: Diagnosis not present

## 2023-07-07 DIAGNOSIS — M6281 Muscle weakness (generalized): Secondary | ICD-10-CM | POA: Diagnosis not present

## 2023-07-07 DIAGNOSIS — R2681 Unsteadiness on feet: Secondary | ICD-10-CM | POA: Diagnosis not present

## 2023-07-10 DIAGNOSIS — R2681 Unsteadiness on feet: Secondary | ICD-10-CM | POA: Diagnosis not present

## 2023-07-10 DIAGNOSIS — M6281 Muscle weakness (generalized): Secondary | ICD-10-CM | POA: Diagnosis not present

## 2023-07-14 DIAGNOSIS — R2681 Unsteadiness on feet: Secondary | ICD-10-CM | POA: Diagnosis not present

## 2023-07-14 DIAGNOSIS — M6281 Muscle weakness (generalized): Secondary | ICD-10-CM | POA: Diagnosis not present

## 2023-07-17 DIAGNOSIS — R2681 Unsteadiness on feet: Secondary | ICD-10-CM | POA: Diagnosis not present

## 2023-07-17 DIAGNOSIS — M6281 Muscle weakness (generalized): Secondary | ICD-10-CM | POA: Diagnosis not present

## 2023-07-18 DIAGNOSIS — E039 Hypothyroidism, unspecified: Secondary | ICD-10-CM | POA: Diagnosis not present

## 2023-07-30 DIAGNOSIS — M6281 Muscle weakness (generalized): Secondary | ICD-10-CM | POA: Diagnosis not present

## 2023-07-30 DIAGNOSIS — R2681 Unsteadiness on feet: Secondary | ICD-10-CM | POA: Diagnosis not present

## 2023-12-25 DIAGNOSIS — E039 Hypothyroidism, unspecified: Secondary | ICD-10-CM | POA: Diagnosis not present

## 2023-12-25 DIAGNOSIS — E782 Mixed hyperlipidemia: Secondary | ICD-10-CM | POA: Diagnosis not present

## 2023-12-25 DIAGNOSIS — Z6822 Body mass index (BMI) 22.0-22.9, adult: Secondary | ICD-10-CM | POA: Diagnosis not present

## 2023-12-25 DIAGNOSIS — M810 Age-related osteoporosis without current pathological fracture: Secondary | ICD-10-CM | POA: Diagnosis not present

## 2023-12-25 DIAGNOSIS — Z0000 Encounter for general adult medical examination without abnormal findings: Secondary | ICD-10-CM | POA: Diagnosis not present

## 2024-01-01 ENCOUNTER — Other Ambulatory Visit: Payer: Self-pay | Admitting: Family Medicine

## 2024-01-01 DIAGNOSIS — F331 Major depressive disorder, recurrent, moderate: Secondary | ICD-10-CM | POA: Diagnosis not present

## 2024-01-01 DIAGNOSIS — Z01419 Encounter for gynecological examination (general) (routine) without abnormal findings: Secondary | ICD-10-CM | POA: Diagnosis not present

## 2024-01-01 DIAGNOSIS — R152 Fecal urgency: Secondary | ICD-10-CM | POA: Diagnosis not present

## 2024-01-01 DIAGNOSIS — R159 Full incontinence of feces: Secondary | ICD-10-CM | POA: Diagnosis not present

## 2024-01-01 DIAGNOSIS — E039 Hypothyroidism, unspecified: Secondary | ICD-10-CM | POA: Diagnosis not present

## 2024-01-01 DIAGNOSIS — M810 Age-related osteoporosis without current pathological fracture: Secondary | ICD-10-CM | POA: Diagnosis not present

## 2024-01-01 DIAGNOSIS — E782 Mixed hyperlipidemia: Secondary | ICD-10-CM

## 2024-01-01 DIAGNOSIS — Z0000 Encounter for general adult medical examination without abnormal findings: Secondary | ICD-10-CM | POA: Diagnosis not present

## 2024-01-01 DIAGNOSIS — J310 Chronic rhinitis: Secondary | ICD-10-CM | POA: Diagnosis not present

## 2024-01-08 ENCOUNTER — Ambulatory Visit
Admission: RE | Admit: 2024-01-08 | Discharge: 2024-01-08 | Disposition: A | Discharge status: Home / Self Care | Payer: Self-pay | Source: Ambulatory Visit | Attending: Family Medicine | Admitting: Family Medicine

## 2024-01-08 DIAGNOSIS — Z136 Encounter for screening for cardiovascular disorders: Secondary | ICD-10-CM | POA: Diagnosis not present

## 2024-01-08 DIAGNOSIS — E782 Mixed hyperlipidemia: Secondary | ICD-10-CM | POA: Insufficient documentation

## 2024-02-02 DIAGNOSIS — H43813 Vitreous degeneration, bilateral: Secondary | ICD-10-CM | POA: Diagnosis not present

## 2024-02-02 DIAGNOSIS — H353131 Nonexudative age-related macular degeneration, bilateral, early dry stage: Secondary | ICD-10-CM | POA: Diagnosis not present

## 2024-02-02 DIAGNOSIS — Z961 Presence of intraocular lens: Secondary | ICD-10-CM | POA: Diagnosis not present

## 2024-03-23 DIAGNOSIS — R42 Dizziness and giddiness: Secondary | ICD-10-CM | POA: Diagnosis not present
